# Patient Record
Sex: Female | Born: 1990 | Race: Black or African American | Hispanic: No | Marital: Single | Smoking: Never smoker
Health system: Southern US, Community
[De-identification: ages and names within clinical notes are randomized; demographics above are authoritative.]

## PROBLEM LIST (undated history)

## (undated) DIAGNOSIS — J45909 Unspecified asthma, uncomplicated: Secondary | ICD-10-CM

## (undated) DIAGNOSIS — I10 Essential (primary) hypertension: Secondary | ICD-10-CM

## (undated) DIAGNOSIS — O24419 Gestational diabetes mellitus in pregnancy, unspecified control: Secondary | ICD-10-CM

## (undated) HISTORY — DX: Essential (primary) hypertension: I10

## (undated) HISTORY — PX: NO PAST SURGERIES: SHX2092

## (undated) HISTORY — DX: Gestational diabetes mellitus in pregnancy, unspecified control: O24.419

---

## 2018-06-08 ENCOUNTER — Other Ambulatory Visit: Payer: Self-pay

## 2018-06-08 ENCOUNTER — Ambulatory Visit (HOSPITAL_COMMUNITY)
Admission: EM | Admit: 2018-06-08 | Discharge: 2018-06-08 | Disposition: A | Discharge status: Home / Self Care | Payer: Self-pay | Attending: Family Medicine | Admitting: Family Medicine

## 2018-06-08 ENCOUNTER — Encounter (HOSPITAL_COMMUNITY): Payer: Self-pay | Admitting: Emergency Medicine

## 2018-06-08 DIAGNOSIS — Z3201 Encounter for pregnancy test, result positive: Secondary | ICD-10-CM

## 2018-06-08 DIAGNOSIS — N925 Other specified irregular menstruation: Secondary | ICD-10-CM

## 2018-06-08 DIAGNOSIS — Z3491 Encounter for supervision of normal pregnancy, unspecified, first trimester: Secondary | ICD-10-CM

## 2018-06-08 HISTORY — DX: Unspecified asthma, uncomplicated: J45.909

## 2018-06-08 LAB — POCT PREGNANCY, URINE: PREG TEST UR: POSITIVE — AB

## 2018-06-08 MED ORDER — PRENATAL COMPLETE 14-0.4 MG PO TABS: 1.0000 | ORAL_TABLET | Freq: Every day | ORAL | 0 refills | Status: DC

## 2018-06-08 NOTE — ED Provider Notes (Signed)
[place]    CSN: [id number] Arrival date & time: 06/08/18  1158     History   Chief Complaint Chief Complaint  Patient presents with  . Possible Pregnancy    HPI Madison Allen is a 27 y.o. female.   Madison Allen presents with requests for pregnancy validation. LMP was two months ago, 8/6. Is not on birth control. Took a home pregnancy test which was positive. Had spotting last week but no further bleeding. Denies abdominal pain, nausea, bleeding, shortness of breath or any other concerns today. No previous pregnancies. Does not take any medications regularly. Hx of asthma.     ROS per HPI.      Past Medical History:  Diagnosis Date  . Asthma     There are no active problems to display for this patient.   History reviewed. No pertinent surgical history.  OB History   None      Home Medications    Prior to Admission medications   Medication Sig Start Date End Date Taking? Authorizing Provider  Prenatal Vit-Fe Fumarate-FA (PRENATAL COMPLETE) 14-0.4 MG TABS Take 1 tablet by mouth daily. 06/08/18   Zigmund Gottron, NP    Family History Family History  Problem Relation Age of Onset  . Diabetes Mother     Social History Social History   Tobacco Use  . Smoking status: Never Smoker  Substance Use Topics  . Alcohol use: Yes  . Drug use: Never     Allergies   Patient has no known allergies.   Review of Systems Review of Systems   Physical Exam Triage Vital Signs ED Triage Vitals [06/08/18 1250]  Enc Vitals Group     BP (!) 151/90     Pulse Rate 77     Resp 18     Temp 98.7 F (37.1 C)     Temp Source Oral     SpO2 100 %     Weight      Height      Head Circumference      Peak Flow      Pain Score      Pain Loc      Pain Edu?      Excl. in [place]?    No data found.  Updated Vital Signs BP (!) 151/90 (BP Location: Left Arm)   Pulse 77   Temp 98.7 F (37.1 C) (Oral)   Resp 18   LMP 04/07/2018   SpO2 100%    Physical  Exam  Constitutional: She is oriented to person, place, and time. She appears well-developed and well-nourished. No distress.  Cardiovascular: Normal rate, regular rhythm and normal heart sounds.  Pulmonary/Chest: Effort normal and breath sounds normal.  Abdominal: Soft. She exhibits no mass. There is no tenderness. There is no guarding.  Genitourinary:  Genitourinary Comments: Denies vaginal bleeding or discharge   Neurological: She is alert and oriented to person, place, and time.  Skin: Skin is warm and dry.     UC Treatments / Results  Labs (all labs ordered are listed, but only abnormal results are displayed) Labs Reviewed  POCT PREGNANCY, URINE - Abnormal; Notable for the following components:      Result Value   Preg Test, Ur POSITIVE (*)    All other components within normal limits    EKG None  Radiology No results found.  Procedures Procedures (including critical care time)  Medications Ordered in UC Medications - No data to display  Initial Impression /  Assessment and Plan / UC Course  I have reviewed the triage vital signs and the nursing notes.  Pertinent labs & imaging results that were available during my care of the patient were reviewed by me and considered in my medical decision making (see chart for details).     Positive urine pregnancy here in clinic today, consistent with positive home test. LMP 8/6, therefore approximately [redacted]w[redacted]d. Prenatal prescribed and encouraged to take daily. Encouraged to call to set up first prenatal appointment with OB. Return precautions provided and safe medication list provided. Patient verbalized understanding and agreeable to plan.  Ambulatory out of clinic without difficulty.   Final Clinical Impressions(s) / UC Diagnoses   Final diagnoses:  Positive pregnancy test     Discharge Instructions     Urine is positive for pregnancy here today.  Based on last period of 04/07/2018 you are approximately 8 weeks 6 days  pregnant.  Take prenatal vitamin daily.  See medications safe during pregnancy if needed.  Please establish with OB for first prenatal appointment.  [facility] to [facility] if develop abdominal pain or bleeding.    ED Prescriptions    Medication Sig Dispense Auth. Provider   Prenatal Vit-Fe Fumarate-FA (PRENATAL COMPLETE) 14-0.4 MG TABS Take 1 tablet by mouth daily. 60 each Zigmund Gottron, NP     Controlled Substance Prescriptions [place] Controlled Substance Registry consulted? Not Applicable   Zigmund Gottron, NP 06/08/18 1313

## 2018-06-08 NOTE — Discharge Instructions (Addendum)
Urine is positive for pregnancy here today.  Based on last period of 04/07/2018 you are approximately 8 weeks 6 days pregnant.  Take prenatal vitamin daily.  See medications safe during pregnancy if needed.  Please establish with OB for first prenatal appointment.  Please present to [facility] if develop abdominal pain or bleeding.

## 2018-06-08 NOTE — ED Triage Notes (Addendum)
Patient has not had a period in 2 months.  Home pregnancy test is positive.  Patient was spotting on Tuesday and Wednesday-oct 1 and 2.

## 2018-07-06 ENCOUNTER — Other Ambulatory Visit: Payer: Self-pay

## 2018-07-06 ENCOUNTER — Ambulatory Visit: Payer: Medicaid Other | Admitting: *Deleted

## 2018-07-06 DIAGNOSIS — O0990 Supervision of high risk pregnancy, unspecified, unspecified trimester: Secondary | ICD-10-CM | POA: Insufficient documentation

## 2018-07-06 DIAGNOSIS — Z3400 Encounter for supervision of normal first pregnancy, unspecified trimester: Secondary | ICD-10-CM

## 2018-07-06 LAB — POCT URINALYSIS DIPSTICK OB
Bilirubin, UA: NEGATIVE
Glucose, UA: NEGATIVE
Ketones, UA: NEGATIVE
NITRITE UA: NEGATIVE
PH UA: 7 (ref 5.0–8.0)
PROTEIN: NEGATIVE
RBC UA: NEGATIVE
SPEC GRAV UA: 1.02 (ref 1.010–1.025)
UROBILINOGEN UA: 0.2 U/dL

## 2018-07-06 NOTE — Progress Notes (Signed)
   PRENATAL INTAKE SUMMARY  Ms. Langenderfer presents today New OB Nurse Interview.  OB History    Gravida  1   Para      Term      Preterm      AB      Living        SAB      TAB      Ectopic      Multiple      Live Births             I have reviewed the patient's medical, obstetrical, social, and family histories, medications, and available lab results.  SUBJECTIVE She has no unusual complaints  OBJECTIVE Initial nurse interview for labs and history (New OB). EDD: 01/12/2019 by LMP GA: [redacted]w[redacted]d G1P0  GENERAL APPEARANCE: alert, well appearing, in no apparent distress, oriented to person, place and time   ASSESSMENT Normal pregnancy.  PLAN Prenatal care-[facility] P[facility]/HIV  OB Urine Culture/dip GC/CT at next visit with CNM HgbEval SMA CF A1C PAP: need records from previous provider. Continue PNV. U/S <14 wks to confirm dating and fetal viability.  Derl Barrow, RN

## 2018-07-06 NOTE — Patient Instructions (Addendum)
Preterm Labor and Birth Information Pregnancy normally lasts 39-41 weeks. Preterm labor is when labor starts early. It starts before you have been pregnant for 37 whole weeks. What are the risk factors for preterm labor? Preterm labor is more likely to occur in women who:  Have an infection while pregnant.  Have a cervix that is short.  Have gone into preterm labor before.  Have had surgery on their cervix.  Are younger than age 27.  Are older than age 71.  Are African American.  Are pregnant with two or more babies.  Take street drugs while pregnant.  Smoke while pregnant.  Do not gain enough weight while pregnant.  Got pregnant right after another pregnancy.  What are the symptoms of preterm labor? Symptoms of preterm labor include:  Cramps. The cramps may feel like the cramps some women get during their period. The cramps may happen with watery poop (diarrhea).  Pain in the belly (abdomen).  Pain in the lower back.  Regular contractions or tightening. It may feel like your belly is getting tighter.  Pressure in the lower belly that seems to get stronger.  More fluid (discharge) leaking from the vagina. The fluid may be watery or bloody.  Water breaking.  Why is it important to notice signs of preterm labor? Babies who are born early may not be fully developed. They have a higher chance for:  Long-term heart problems.  Long-term lung problems.  Trouble controlling body systems, like breathing.  Bleeding in the brain.  A condition called cerebral palsy.  Learning difficulties.  Death.  These risks are highest for babies who are born before 30 weeks of pregnancy. How is preterm labor treated? Treatment depends on:  How long you were pregnant.  Your condition.  The health of your baby.  Treatment may involve:  Having a stitch (suture) placed in your cervix. When you give birth, your cervix opens so the baby can come out. The stitch keeps  the cervix from opening too soon.  Staying at the hospital.  Taking or getting medicines, such as: ? Hormone medicines. ? Medicines to stop contractions. ? Medicines to help the baby's lungs develop. ? Medicines to prevent your baby from having cerebral palsy.  What should I do if I am in preterm labor? If you think you are going into labor too soon, call your doctor right away. How can I prevent preterm labor?  Do not use any tobacco products. ? Examples of these are cigarettes, chewing tobacco, and e-cigarettes. ? If you need help quitting, ask your doctor.  Do not use street drugs.  Do not use any medicines unless you ask your doctor if they are safe for you.  Talk with your doctor before taking any herbal supplements.  Make sure you gain enough weight.  Watch for infection. If you think you might have an infection, get it checked right away.  If you have gone into preterm labor before, tell your doctor. This information is not intended to replace advice given to you by your health care provider. Make sure you discuss any questions you have with your health care provider. Document Released: 11/15/2008 Document Revised: 01/30/2016 Document Reviewed: 01/10/2016 Elsevier Interactive Patient Education  2018 [place] of Pregnancy The second trimester is from week 13 through week 28, month 4 through 6. This is often the time in pregnancy that you feel your best. Often times, morning sickness has lessened or quit. You may have  more energy, and you may get hungry more often. Your unborn baby (fetus) is growing rapidly. At the end of the sixth month, he or she is about 9 inches long and weighs about 1 pounds. You will likely feel the baby move (quickening) between 18 and 20 weeks of pregnancy. Follow these instructions at home:  Avoid all smoking, herbs, and alcohol. Avoid drugs not approved by your doctor.  Do not use any tobacco products, including  cigarettes, chewing tobacco, and electronic cigarettes. If you need help quitting, ask your doctor. You may get counseling or other support to help you quit.  Only take medicine as told by your doctor. Some medicines are safe and some are not during pregnancy.  Exercise only as told by your doctor. Stop exercising if you start having cramps.  Eat regular, healthy meals.  Wear a good support bra if your breasts are tender.  Do not use hot tubs, steam rooms, or saunas.  Wear your seat belt when driving.  Avoid raw meat, uncooked cheese, and liter boxes and soil used by cats.  Take your prenatal vitamins.  Take 1500-2000 milligrams of calcium daily starting at the 20th week of pregnancy until you deliver your baby.  Try taking medicine that helps you poop (stool softener) as needed, and if your doctor approves. Eat more fiber by eating fresh fruit, vegetables, and whole grains. Drink enough fluids to keep your pee (urine) clear or pale yellow.  Take warm water baths (sitz baths) to soothe pain or discomfort caused by hemorrhoids. Use hemorrhoid cream if your doctor approves.  If you have puffy, bulging veins (varicose veins), wear support hose. Raise (elevate) your feet for 15 minutes, 3-4 times a day. Limit salt in your diet.  Avoid heavy lifting, wear low heals, and sit up straight.  Rest with your legs raised if you have leg cramps or low back pain.  Visit your dentist if you have not gone during your pregnancy. Use a soft toothbrush to brush your teeth. Be gentle when you floss.  You can have sex (intercourse) unless your doctor tells you not to.  Go to your doctor visits. Get help if:  You feel dizzy.  You have mild cramps or pressure in your lower belly (abdomen).  You have a nagging pain in your belly area.  You continue to feel sick to your stomach (nauseous), throw up (vomit), or have watery poop (diarrhea).  You have bad smelling fluid coming from your  vagina.  You have pain with peeing (urination). Get help right away if:  You have a fever.  You are leaking fluid from your vagina.  You have spotting or bleeding from your vagina.  You have severe belly cramping or pain.  You lose or gain weight rapidly.  You have trouble catching your breath and have chest pain.  You notice sudden or extreme puffiness (swelling) of your face, hands, ankles, feet, or legs.  You have not felt the baby move in over an hour.  You have severe headaches that do not go away with medicine.  You have vision changes. This information is not intended to replace advice given to you by your health care provider. Make sure you discuss any questions you have with your health care provider. Document Released: 11/13/2009 Document Revised: 01/25/2016 Document Reviewed: 10/20/2012 Elsevier Interactive Patient Education  2017 [employer].

## 2018-07-08 LAB — CULTURE, OB URINE

## 2018-07-08 LAB — URINE CULTURE, OB REFLEX: Organism ID, Bacteria: NO GROWTH

## 2018-07-09 ENCOUNTER — Other Ambulatory Visit: Payer: Self-pay | Admitting: Obstetrics and Gynecology

## 2018-07-09 ENCOUNTER — Ambulatory Visit (HOSPITAL_COMMUNITY)
Admission: RE | Admit: 2018-07-09 | Discharge: 2018-07-09 | Disposition: A | Discharge status: Home / Self Care | Payer: Medicaid Other | Source: Ambulatory Visit | Attending: Obstetrics and Gynecology | Admitting: Obstetrics and Gynecology

## 2018-07-09 DIAGNOSIS — Z3689 Encounter for other specified antenatal screening: Secondary | ICD-10-CM | POA: Insufficient documentation

## 2018-07-09 DIAGNOSIS — Z3A12 12 weeks gestation of pregnancy: Secondary | ICD-10-CM | POA: Insufficient documentation

## 2018-07-09 DIAGNOSIS — Z3400 Encounter for supervision of normal first pregnancy, unspecified trimester: Secondary | ICD-10-CM

## 2018-07-15 LAB — OBSTETRIC PANEL, INCLUDING HIV
ANTIBODY SCREEN: NEGATIVE
BASOS: 0 %
Basophils Absolute: 0 10*3/uL (ref 0.0–0.2)
EOS (ABSOLUTE): 0.2 10*3/uL (ref 0.0–0.4)
EOS: 3 %
HEMATOCRIT: 37.4 % (ref 34.0–46.6)
HEMOGLOBIN: 13.3 g/dL (ref 11.1–15.9)
HIV Screen 4th Generation wRfx: NONREACTIVE
Hepatitis B Surface Ag: NEGATIVE
Immature Grans (Abs): 0 10*3/uL (ref 0.0–0.1)
Immature Granulocytes: 0 %
Lymphocytes Absolute: 2 10*3/uL (ref 0.7–3.1)
Lymphs: 29 %
MCH: 31.8 pg (ref 26.6–33.0)
MCHC: 35.6 g/dL (ref 31.5–35.7)
MCV: 90 fL (ref 79–97)
MONOCYTES: 10 %
Monocytes Absolute: 0.7 10*3/uL (ref 0.1–0.9)
Neutrophils Absolute: 3.9 10*3/uL (ref 1.4–7.0)
Neutrophils: 58 %
Platelets: 226 10*3/uL (ref 150–450)
RBC: 4.18 x10E6/uL (ref 3.77–5.28)
RDW: 13.2 % (ref 12.3–15.4)
RH TYPE: POSITIVE
RPR Ser Ql: NONREACTIVE
RUBELLA: 3.32 {index} (ref >0.99)
WBC: 6.8 10*3/uL (ref 3.4–10.8)

## 2018-07-15 LAB — HEMOGLOBIN A1C
ESTIMATED AVERAGE GLUCOSE: 100 mg/dL
HEMOGLOBIN A1C: 5.1 % (ref 4.8–5.6)

## 2018-07-15 LAB — CYSTIC FIBROSIS MUTATION 97: Interpretation: NOT DETECTED

## 2018-07-15 LAB — SICKLE CELL SCREEN: Sickle Cell Screen: NEGATIVE

## 2018-07-17 ENCOUNTER — Encounter: Payer: Self-pay | Admitting: General Practice

## 2018-07-17 ENCOUNTER — Encounter: Payer: Self-pay | Admitting: Family

## 2018-07-17 ENCOUNTER — Ambulatory Visit (INDEPENDENT_AMBULATORY_CARE_PROVIDER_SITE_OTHER): Payer: Medicaid Other | Admitting: Family

## 2018-07-17 ENCOUNTER — Other Ambulatory Visit (HOSPITAL_COMMUNITY)
Admission: RE | Admit: 2018-07-17 | Discharge: 2018-07-17 | Disposition: A | Discharge status: Home / Self Care | Payer: Medicaid Other | Source: Ambulatory Visit | Attending: Family | Admitting: Family

## 2018-07-17 VITALS — BP 130/79 | HR 78 | Wt 196.6 lb

## 2018-07-17 DIAGNOSIS — O162 Unspecified maternal hypertension, second trimester: Secondary | ICD-10-CM

## 2018-07-17 DIAGNOSIS — Z3400 Encounter for supervision of normal first pregnancy, unspecified trimester: Secondary | ICD-10-CM | POA: Diagnosis present

## 2018-07-17 DIAGNOSIS — Z3402 Encounter for supervision of normal first pregnancy, second trimester: Secondary | ICD-10-CM

## 2018-07-17 DIAGNOSIS — O10919 Unspecified pre-existing hypertension complicating pregnancy, unspecified trimester: Secondary | ICD-10-CM | POA: Insufficient documentation

## 2018-07-17 DIAGNOSIS — Z833 Family history of diabetes mellitus: Secondary | ICD-10-CM

## 2018-07-17 NOTE — Addendum Note (Signed)
Addended by: Tommas Olp B on: 07/17/2018 09:56 AM   Modules accepted: Orders

## 2018-07-17 NOTE — Patient Instructions (Signed)

## 2018-07-17 NOTE — Progress Notes (Signed)
  Subjective:    Madison Allen is a G1P0 71w3dbeing seen today for her first obstetrical visit.  Here with FOB JFara Oldenwho plans to attend each visit.  Her obstetrical history is significant for first pregnancy. Patient does intend to breast feed. Pregnancy history fully reviewed.  Patient reports nausea and vomiting in early pregnancy which has resolved in the past week.  Fatigue also improving.  .  There were no vitals filed for this visit.  HISTORY: OB History  Gravida Para Term Preterm AB Living  1            SAB TAB Ectopic Multiple Live Births               # Outcome Date GA Lbr Len/2nd Weight Sex Delivery Anes PTL Lv  1 Current            Past Medical History:  Diagnosis Date  . Asthma    Past Surgical History:  Procedure Laterality Date  . NO PAST SURGERIES     Family History  Problem Relation Age of Onset  . Diabetes Mother      Exam    BP 130/79   Pulse 78   Wt 196 lb 9.6 oz (89.2 kg)   LMP 04/07/2018 (Exact Date)   BMI 29.89 kg/m  Uterine Size: size equals dates  Pelvic Exam:    Perineum: No Hemorrhoids, Normal Perineum   Vulva: normal   Vagina:  normal mucosa, normal discharge, no palpable nodules   pH: Not done   Cervix: Slight bleeding following Pap, yellowish discharge; no cervical motion tenderness and no lesions   Adnexa: normal adnexa and no mass, fullness, tenderness   Bony Pelvis: Adequate  System: Breast:  No nipple retraction or dimpling, No nipple discharge or bleeding, No axillary or supraclavicular adenopathy, Normal to palpation without dominant masses   Skin: normal coloration and turgor, no rashes    Neurologic: negative   Extremities: normal strength, tone, and muscle mass   HEENT neck supple with midline trachea and thyroid without masses   Mouth/Teeth mucous membranes moist, pharynx normal without lesions   Neck supple and no masses   Cardiovascular: regular rate and rhythm, no murmurs or gallops   Respiratory:  appears well,  vitals normal, no respiratory distress, acyanotic, normal RR, neck free of mass or lymphadenopathy, chest clear, no wheezing, crepitations, rhonchi, normal symmetric air entry   Abdomen: soft, non-tender; bowel sounds normal; no masses,  no organomegaly   Urinary: urethral meatus normal    Assessment:    Pregnancy: G1P0 Patient Active Problem List   Diagnosis Date Noted  . Supervision of normal first pregnancy, antepartum 07/06/2018        Plan:     Initial labs reviewed. Prenatal vitamins. Problem list reviewed and updated. Genetic Screening discussed: desires [place] .  Will obtain 2 hr at that visit as well.  Ultrasound discussed; fetal survey: Plan to order at 19-20 weeks at next visit. [facility] and may have a provider in labor that they have not met.  Follow up in 2 weeks for lab visit and 4 weeks for appt.   WVenia CarbonN Karim-Rhoades 07/17/2018

## 2018-07-20 LAB — CYTOLOGY - PAP: DIAGNOSIS: NEGATIVE

## 2018-07-23 ENCOUNTER — Encounter: Payer: Self-pay | Admitting: General Practice

## 2018-07-29 ENCOUNTER — Encounter: Payer: Self-pay | Admitting: General Practice

## 2018-07-29 ENCOUNTER — Other Ambulatory Visit (INDEPENDENT_AMBULATORY_CARE_PROVIDER_SITE_OTHER): Payer: Medicaid Other

## 2018-07-29 DIAGNOSIS — O162 Unspecified maternal hypertension, second trimester: Secondary | ICD-10-CM | POA: Diagnosis not present

## 2018-07-29 DIAGNOSIS — Z833 Family history of diabetes mellitus: Secondary | ICD-10-CM

## 2018-07-29 NOTE — Progress Notes (Signed)
Patient is here for lab work only

## 2018-07-30 LAB — COMPREHENSIVE METABOLIC PANEL
ALT: 20 IU/L (ref 0–32)
AST: 19 IU/L (ref 0–40)
Albumin/Globulin Ratio: 1.5 (ref 1.2–2.2)
Albumin: 4 g/dL (ref 3.5–5.5)
Alkaline Phosphatase: 63 IU/L (ref 39–117)
BILIRUBIN TOTAL: 0.4 mg/dL (ref 0.0–1.2)
BUN/Creatinine Ratio: 10 (ref 9–23)
BUN: 7 mg/dL (ref 6–20)
CALCIUM: 9.4 mg/dL (ref 8.7–10.2)
CHLORIDE: 102 mmol/L (ref 96–106)
CO2: 20 mmol/L (ref 20–29)
CREATININE: 0.71 mg/dL (ref 0.57–1.00)
GFR calc Af Amer: 135 mL/min/{1.73_m2} (ref >59)
GFR calc non Af Amer: 117 mL/min/{1.73_m2} (ref >59)
GLUCOSE: 87 mg/dL (ref 65–99)
Globulin, Total: 2.6 g/dL (ref 1.5–4.5)
Potassium: 4.1 mmol/L (ref 3.5–5.2)
Sodium: 136 mmol/L (ref 134–144)
TOTAL PROTEIN: 6.6 g/dL (ref 6.0–8.5)

## 2018-07-30 LAB — PROTEIN / CREATININE RATIO, URINE
Creatinine, Urine: 134.8 mg/dL
PROTEIN/CREAT RATIO: 56 mg/g{creat} (ref 0–200)
Protein, Ur: 7.6 mg/dL

## 2018-07-30 LAB — GLUCOSE TOLERANCE, 2 HOURS W/ 1HR
GLUCOSE, 2 HOUR: 97 mg/dL (ref 65–152)
GLUCOSE, FASTING: 82 mg/dL (ref 65–91)
Glucose, 1 hour: 112 mg/dL (ref 65–179)

## 2018-08-07 ENCOUNTER — Encounter: Payer: Self-pay | Admitting: General Practice

## 2018-08-10 ENCOUNTER — Encounter: Payer: Self-pay | Admitting: General Practice

## 2018-08-14 ENCOUNTER — Ambulatory Visit (INDEPENDENT_AMBULATORY_CARE_PROVIDER_SITE_OTHER): Payer: Medicaid Other | Admitting: Family

## 2018-08-14 ENCOUNTER — Encounter: Payer: Self-pay | Admitting: Family

## 2018-08-14 VITALS — BP 130/78 | HR 75 | Wt 202.0 lb

## 2018-08-14 DIAGNOSIS — O10919 Unspecified pre-existing hypertension complicating pregnancy, unspecified trimester: Secondary | ICD-10-CM

## 2018-08-14 DIAGNOSIS — O162 Unspecified maternal hypertension, second trimester: Secondary | ICD-10-CM

## 2018-08-14 DIAGNOSIS — Z3A19 19 weeks gestation of pregnancy: Secondary | ICD-10-CM

## 2018-08-14 DIAGNOSIS — O10912 Unspecified pre-existing hypertension complicating pregnancy, second trimester: Secondary | ICD-10-CM

## 2018-08-14 MED ORDER — BLOOD PRESSURE MONITORING DEVI: 0 refills | Status: DC

## 2018-08-14 NOTE — Progress Notes (Signed)
   PRENATAL VISIT NOTE  Subjective:  Madison Allen is a 27 y.o. G1P0 at [redacted]w[redacted]d being seen today for ongoing prenatal care.  Here with FOB Fara Olden.  She is currently monitored for the following issues for this low-risk pregnancy and has Supervision of normal first pregnancy, antepartum and Elevated blood pressure affecting pregnancy in second trimester, antepartum on their problem list.  Patient reports fatigue.  Contractions: Not present. Vag. Bleeding: None.  Movement: Present. Denies leaking of fluid.   The following portions of the patient's history were reviewed and updated as appropriate: allergies, current medications, past family history, past medical history, past social history, past surgical history and problem list. Problem list updated.  Objective:   Vitals:   08/14/18 1112 08/14/18 1119  BP: 136/90 130/78  Pulse: 83 75  Weight: 202 lb (91.6 kg)     Fetal Status: Fetal Heart Rate (bpm): 147 Fundal Height: 20 cm Movement: Present     General:  Alert, oriented and cooperative. Patient is in no acute distress.  Skin: Skin is warm and dry. No rash noted.   Cardiovascular: Normal heart rate noted  Respiratory: Normal respiratory effort, no problems with respiration noted  Abdomen: Soft, gravid, appropriate for gestational age.  Pain/Pressure: Present     Pelvic: Cervical exam deferred        Extremities: Normal range of motion.  Edema: None  Mental Status: Normal mood and affect. Normal behavior. Normal judgment and thought content.   Assessment and Plan:  Pregnancy: G1P0 at [redacted]w[redacted]d  1. Supervision of normal first pregnancy, antepartum - Reviewed lab results - Obtain GC/CT (unable to urinate)  2. Elevated blood pressure affecting pregnancy in second trimester, antepartum - [place] MFM OB DETAIL +14 WK; Future - Blood Pressure Monitoring DEVI; Check once/week; if greater than 150/100 notify office  Dispense: 1 Device; Refill: 0 - Enroll Patient in Babyscripts  3. [redacted] weeks  gestation of pregnancy - [place] MFM OB DETAIL +14 WK; Future  Preterm labor symptoms and general obstetric precautions including but not limited to vaginal bleeding, contractions, leaking of fluid and fetal movement were reviewed in detail with the patient. Please refer to After Visit Summary for other counseling recommendations.  Return in about 4 weeks (around 09/11/2018).  Future Appointments  Date Time Provider [place]  08/21/2018 11:00 AM [facility] [place] 3 WH-MFCUS MFC-US  09/11/2018 10:10 AM Cyndee Brightly, CNM [facility] None    Cyndee Brightly, CNM

## 2018-08-21 ENCOUNTER — Ambulatory Visit (HOSPITAL_COMMUNITY)
Admission: RE | Admit: 2018-08-21 | Discharge: 2018-08-21 | Disposition: A | Discharge status: Home / Self Care | Payer: Medicaid Other | Source: Ambulatory Visit | Attending: Family | Admitting: Family

## 2018-08-21 ENCOUNTER — Encounter (HOSPITAL_COMMUNITY): Payer: Self-pay

## 2018-08-21 ENCOUNTER — Other Ambulatory Visit (HOSPITAL_COMMUNITY): Payer: Self-pay | Admitting: *Deleted

## 2018-08-21 DIAGNOSIS — O162 Unspecified maternal hypertension, second trimester: Secondary | ICD-10-CM

## 2018-08-21 DIAGNOSIS — Z3A19 19 weeks gestation of pregnancy: Secondary | ICD-10-CM | POA: Insufficient documentation

## 2018-08-21 DIAGNOSIS — O10919 Unspecified pre-existing hypertension complicating pregnancy, unspecified trimester: Secondary | ICD-10-CM

## 2018-09-02 NOTE — L&D Delivery Note (Addendum)
Delivery Note Progressed quickly to complete dilation and began to push involuntarily  At 7:20 AM a viable and healthy female was delivered via Vaginal, Spontaneous (Presentation:LOA ).  APGAR: 9, 9; weight  .   Placenta status: spontaneous and grossly intact with 3 vessel Cord:  with the following complications: none  Anesthesia:  Local with repair Episiotomy: None Lacerations: 2nd degree;Labial Suture Repair: vicryl rapide Est. Blood Loss (mL):  Being measured  Mom to postpartum.  Baby to Couplet care / Skin to Skin.  Hansel Feinstein 01/13/2019, 7:58 AM  Please schedule this patient for Postpartum visit in: 1 week with the following provider: Any provider For C/S patients schedule nurse incision check in weeks 2 weeks: no High risk pregnancy complicated by: HTN Delivery mode:  SVD Anticipated Birth Control:  OCPs PP Procedures needed: BP check  Schedule Integrated [place] visit: no

## 2018-09-03 ENCOUNTER — Encounter: Payer: Self-pay | Admitting: Family

## 2018-09-11 ENCOUNTER — Ambulatory Visit (INDEPENDENT_AMBULATORY_CARE_PROVIDER_SITE_OTHER): Payer: Medicaid Other | Admitting: Obstetrics and Gynecology

## 2018-09-11 ENCOUNTER — Encounter: Payer: Self-pay | Admitting: Obstetrics and Gynecology

## 2018-09-11 VITALS — BP 123/71 | HR 88 | Wt 200.4 lb

## 2018-09-11 DIAGNOSIS — O0992 Supervision of high risk pregnancy, unspecified, second trimester: Secondary | ICD-10-CM

## 2018-09-11 DIAGNOSIS — Z3A22 22 weeks gestation of pregnancy: Secondary | ICD-10-CM

## 2018-09-11 DIAGNOSIS — O10912 Unspecified pre-existing hypertension complicating pregnancy, second trimester: Secondary | ICD-10-CM

## 2018-09-11 DIAGNOSIS — O10919 Unspecified pre-existing hypertension complicating pregnancy, unspecified trimester: Secondary | ICD-10-CM

## 2018-09-11 DIAGNOSIS — O0990 Supervision of high risk pregnancy, unspecified, unspecified trimester: Secondary | ICD-10-CM

## 2018-09-11 MED ORDER — BLOOD PRESSURE MONITOR DEVI: 0 refills | Status: DC

## 2018-09-11 MED ORDER — ASPIRIN EC 81 MG PO TBEC: 81.0000 mg | DELAYED_RELEASE_TABLET | Freq: Every day | ORAL | 2 refills | Status: DC

## 2018-09-11 NOTE — Progress Notes (Signed)
   PRENATAL VISIT NOTE  Subjective:  Madison Allen is a 28 y.o. G1P0 at [redacted]w[redacted]d being seen today for ongoing prenatal care.  She is currently monitored for the following issues for this high-risk pregnancy and has Supervision of high risk pregnancy, antepartum and Chronic hypertension affecting pregnancy on their problem list.  Patient reports no complaints.  Contractions: Not present. Vag. Bleeding: None.  Movement: Present. Denies leaking of fluid.   The following portions of the patient's history were reviewed and updated as appropriate: allergies, current medications, past family history, past medical history, past social history, past surgical history and problem list. Problem list updated.  Objective:   Vitals:   09/11/18 1014 09/11/18 1031  BP: (!) 149/79 123/71  Pulse: 96 88  Weight: 200 lb 6.4 oz (90.9 kg)     Fetal Status: Fetal Heart Rate (bpm): 145 Fundal Height: 22 cm Movement: Present     General:  Alert, oriented and cooperative. Patient is in no acute distress.  Skin: Skin is warm and dry. No rash noted.   Cardiovascular: Normal heart rate noted  Respiratory: Normal respiratory effort, no problems with respiration noted  Abdomen: Soft, gravid, appropriate for gestational age.  Pain/Pressure: Absent     Pelvic: Cervical exam deferred        Extremities: Normal range of motion.  Edema: None  Mental Status: Normal mood and affect. Normal behavior. Normal judgment and thought content.   Assessment and Plan:  Pregnancy: G1P0 at [redacted]w[redacted]d  1. Supervision of high risk pregnancy, antepartum Patient is doing well without complaints Third trimester labs next visit  2. Chronic hypertension affecting pregnancy Asymptomatic without medication Follow up growth ultrasound on 1/17 Rx ASA provided  Preterm labor symptoms and general obstetric precautions including but not limited to vaginal bleeding, contractions, leaking of fluid and fetal movement were reviewed in detail with  the patient. Please refer to After Visit Summary for other counseling recommendations.  Return in about 4 weeks (around 10/09/2018) for ROB, 2 hr glucola next visit.  Future Appointments  Date Time Provider [place]  09/18/2018 12:45 PM [place] 2 WH-MFCUS [facility]-US    Mora Bellman, MD

## 2018-09-18 ENCOUNTER — Other Ambulatory Visit (HOSPITAL_COMMUNITY): Payer: Self-pay | Admitting: *Deleted

## 2018-09-18 ENCOUNTER — Encounter (HOSPITAL_COMMUNITY): Payer: Self-pay

## 2018-09-18 ENCOUNTER — Ambulatory Visit (HOSPITAL_COMMUNITY)
Admission: RE | Admit: 2018-09-18 | Discharge: 2018-09-18 | Disposition: A | Discharge status: Home / Self Care | Payer: Medicaid Other | Source: Ambulatory Visit | Attending: Family | Admitting: Family

## 2018-09-18 DIAGNOSIS — O10919 Unspecified pre-existing hypertension complicating pregnancy, unspecified trimester: Secondary | ICD-10-CM | POA: Diagnosis present

## 2018-09-18 DIAGNOSIS — O162 Unspecified maternal hypertension, second trimester: Secondary | ICD-10-CM

## 2018-09-18 DIAGNOSIS — Z3A23 23 weeks gestation of pregnancy: Secondary | ICD-10-CM

## 2018-09-18 DIAGNOSIS — O0990 Supervision of high risk pregnancy, unspecified, unspecified trimester: Secondary | ICD-10-CM | POA: Diagnosis present

## 2018-09-18 DIAGNOSIS — Z362 Encounter for other antenatal screening follow-up: Secondary | ICD-10-CM

## 2018-10-09 ENCOUNTER — Other Ambulatory Visit: Payer: Medicaid Other | Admitting: *Deleted

## 2018-10-09 ENCOUNTER — Ambulatory Visit (INDEPENDENT_AMBULATORY_CARE_PROVIDER_SITE_OTHER): Payer: Medicaid Other | Admitting: Family

## 2018-10-09 VITALS — BP 119/74 | HR 84 | Temp 98.1°F | Wt 205.4 lb

## 2018-10-09 DIAGNOSIS — O0990 Supervision of high risk pregnancy, unspecified, unspecified trimester: Secondary | ICD-10-CM

## 2018-10-09 DIAGNOSIS — O0992 Supervision of high risk pregnancy, unspecified, second trimester: Secondary | ICD-10-CM

## 2018-10-09 DIAGNOSIS — O10912 Unspecified pre-existing hypertension complicating pregnancy, second trimester: Secondary | ICD-10-CM

## 2018-10-09 DIAGNOSIS — O10919 Unspecified pre-existing hypertension complicating pregnancy, unspecified trimester: Secondary | ICD-10-CM

## 2018-10-09 NOTE — Progress Notes (Signed)
   Patient in clinic for 28 week lab work.  Derl Barrow, RN

## 2018-10-09 NOTE — Progress Notes (Signed)
   PRENATAL VISIT NOTE  Subjective:  Madison Allen is a 28 y.o. G1P0 at [redacted]w[redacted]d being seen today for ongoing prenatal care.  She is currently monitored for the following issues for this high-risk pregnancy and has Supervision of high risk pregnancy, antepartum and Chronic hypertension affecting pregnancy on their problem list.  Patient reports no complaints.  Denies headache, vision changes, or epigastric pain.  Contractions: Not present. Vag. Bleeding: None.  Movement: Present. Denies leaking of fluid.   The following portions of the patient's history were reviewed and updated as appropriate: allergies, current medications, past family history, past medical history, past social history, past surgical history and problem list. Problem list updated.  Objective:   Vitals:   10/09/18 0823 10/09/18 0847  BP: (!) 167/97 119/74  Pulse: 94 84  Temp: 98.1 F (36.7 C)   Weight: 205 lb 6.4 oz (93.2 kg)   Blood pressure retaken; first obtained with increased patient movement  Fetal Status: Fetal Heart Rate (bpm): 143 Fundal Height: 27 cm Movement: Present     General:  Alert, oriented and cooperative. Patient is in no acute distress.  Skin: Skin is warm and dry. No rash noted.   Cardiovascular: Normal heart rate noted  Respiratory: Normal respiratory effort, no problems with respiration noted  Abdomen: Soft, gravid, appropriate for gestational age.  Pain/Pressure: Absent     Pelvic: Cervical exam deferred        Extremities: Normal range of motion.  Edema: Trace  Mental Status: Normal mood and affect. Normal behavior. Normal judgment and thought content.   Assessment and Plan:  Pregnancy: G1P0 at [redacted]w[redacted]d  1. Supervision of high risk pregnancy, antepartum - HIV Antibody (routine testing w rflx) - RPR - CBC - Culture, OB Urine - Urine cytology ancillary only([facility])  2. Chronic hypertension affecting pregnancy - Continue ASA -  Reviewed previous growth ultrasound (23 weeks > 45%ile);  next one scheduled 10/16/18 - Comprehensive metabolic panel - Protein / creatinine ratio, urine - Reviewed symptoms of prex  Preterm labor symptoms and general obstetric precautions including but not limited to vaginal bleeding, contractions, leaking of fluid and fetal movement were reviewed in detail with the patient. Please refer to After Visit Summary for other counseling recommendations.  Return in about 2 weeks (around 10/23/2018).  Future Appointments  Date Time Provider [place]  10/16/2018  1:30 PM WH-[facility] [place] 1 WH-MFCUS MFC-US    Cyndee Brightly, CNM

## 2018-10-10 LAB — COMPREHENSIVE METABOLIC PANEL
ALT: 14 IU/L (ref 0–32)
AST: 14 IU/L (ref 0–40)
Albumin/Globulin Ratio: 1.1 — ABNORMAL LOW (ref 1.2–2.2)
Albumin: 3.2 g/dL — ABNORMAL LOW (ref 3.9–5.0)
Alkaline Phosphatase: 77 IU/L (ref 39–117)
BUN / CREAT RATIO: 11 (ref 9–23)
BUN: 7 mg/dL (ref 6–20)
Bilirubin Total: 0.3 mg/dL (ref 0.0–1.2)
CO2: 20 mmol/L (ref 20–29)
Calcium: 8.5 mg/dL — ABNORMAL LOW (ref 8.7–10.2)
Chloride: 101 mmol/L (ref 96–106)
Creatinine, Ser: 0.65 mg/dL (ref 0.57–1.00)
GFR calc non Af Amer: 122 mL/min/{1.73_m2} (ref >59)
GFR, EST AFRICAN AMERICAN: 141 mL/min/{1.73_m2} (ref >59)
Globulin, Total: 2.8 g/dL (ref 1.5–4.5)
Glucose: 133 mg/dL — ABNORMAL HIGH (ref 65–99)
Potassium: 4 mmol/L (ref 3.5–5.2)
Sodium: 136 mmol/L (ref 134–144)
TOTAL PROTEIN: 6 g/dL (ref 6.0–8.5)

## 2018-10-10 LAB — CBC
HEMATOCRIT: 32.4 % — AB (ref 34.0–46.6)
Hemoglobin: 11.3 g/dL (ref 11.1–15.9)
MCH: 32.3 pg (ref 26.6–33.0)
MCHC: 34.9 g/dL (ref 31.5–35.7)
MCV: 93 fL (ref 79–97)
Platelets: 196 10*3/uL (ref 150–450)
RBC: 3.5 x10E6/uL — ABNORMAL LOW (ref 3.77–5.28)
RDW: 12.6 % (ref 11.7–15.4)
WBC: 6.2 10*3/uL (ref 3.4–10.8)

## 2018-10-10 LAB — URINE CYTOLOGY ANCILLARY ONLY
Chlamydia: NEGATIVE
Neisseria Gonorrhea: NEGATIVE

## 2018-10-10 LAB — HIV ANTIBODY (ROUTINE TESTING W REFLEX): HIV SCREEN 4TH GENERATION: NONREACTIVE

## 2018-10-10 LAB — PROTEIN / CREATININE RATIO, URINE
Creatinine, Urine: 99.1 mg/dL
Protein, Ur: 9.7 mg/dL
Protein/Creat Ratio: 98 mg/g creat (ref 0–200)

## 2018-10-10 LAB — GLUCOSE TOLERANCE, 2 HOURS W/ 1HR
GLUCOSE, 2 HOUR: 80 mg/dL (ref 65–152)
GLUCOSE, FASTING: 83 mg/dL (ref 65–91)
Glucose, 1 hour: 138 mg/dL (ref 65–179)

## 2018-10-10 LAB — RPR: RPR: NONREACTIVE

## 2018-10-12 LAB — CULTURE, OB URINE

## 2018-10-12 LAB — URINE CULTURE, OB REFLEX

## 2018-10-16 ENCOUNTER — Encounter (HOSPITAL_COMMUNITY): Payer: Self-pay

## 2018-10-16 ENCOUNTER — Ambulatory Visit (HOSPITAL_COMMUNITY)
Admission: RE | Admit: 2018-10-16 | Discharge: 2018-10-16 | Disposition: A | Discharge status: Home / Self Care | Payer: Medicaid Other | Source: Ambulatory Visit | Attending: Family | Admitting: Family

## 2018-10-16 ENCOUNTER — Other Ambulatory Visit (HOSPITAL_COMMUNITY): Payer: Self-pay | Admitting: *Deleted

## 2018-10-16 DIAGNOSIS — O0990 Supervision of high risk pregnancy, unspecified, unspecified trimester: Secondary | ICD-10-CM | POA: Diagnosis present

## 2018-10-16 DIAGNOSIS — O10919 Unspecified pre-existing hypertension complicating pregnancy, unspecified trimester: Secondary | ICD-10-CM

## 2018-10-16 DIAGNOSIS — Z3A27 27 weeks gestation of pregnancy: Secondary | ICD-10-CM

## 2018-10-16 DIAGNOSIS — Z362 Encounter for other antenatal screening follow-up: Secondary | ICD-10-CM | POA: Diagnosis not present

## 2018-10-16 DIAGNOSIS — O10012 Pre-existing essential hypertension complicating pregnancy, second trimester: Secondary | ICD-10-CM | POA: Diagnosis not present

## 2018-10-23 ENCOUNTER — Other Ambulatory Visit: Payer: Self-pay

## 2018-10-23 ENCOUNTER — Ambulatory Visit (INDEPENDENT_AMBULATORY_CARE_PROVIDER_SITE_OTHER): Payer: Medicaid Other | Admitting: Family

## 2018-10-23 VITALS — BP 134/76 | HR 88 | Wt 206.0 lb

## 2018-10-23 DIAGNOSIS — O10913 Unspecified pre-existing hypertension complicating pregnancy, third trimester: Secondary | ICD-10-CM

## 2018-10-23 DIAGNOSIS — O0990 Supervision of high risk pregnancy, unspecified, unspecified trimester: Secondary | ICD-10-CM

## 2018-10-23 DIAGNOSIS — O0993 Supervision of high risk pregnancy, unspecified, third trimester: Secondary | ICD-10-CM

## 2018-10-23 DIAGNOSIS — O10919 Unspecified pre-existing hypertension complicating pregnancy, unspecified trimester: Secondary | ICD-10-CM

## 2018-10-23 DIAGNOSIS — Z23 Encounter for immunization: Secondary | ICD-10-CM | POA: Diagnosis not present

## 2018-10-23 DIAGNOSIS — Z3A28 28 weeks gestation of pregnancy: Secondary | ICD-10-CM

## 2018-10-23 NOTE — Progress Notes (Signed)
   PRENATAL VISIT NOTE  Subjective:  Madison Allen is a 28 y.o. G1P0 at [redacted]w[redacted]d being seen today for ongoing prenatal care.  She is currently monitored for the following issues for this high-risk pregnancy and has Supervision of high risk pregnancy, antepartum and Chronic hypertension affecting pregnancy on their problem list.  Patient reports no complaints.  Contractions: Not present. Vag. Bleeding: None.  Movement: Present. Denies leaking of fluid.   The following portions of the patient's history were reviewed and updated as appropriate: allergies, current medications, past family history, past medical history, past social history, past surgical history and problem list. Problem list updated.  Objective:   Vitals:   10/23/18 1110  BP: 134/76  Pulse: 88  Weight: 206 lb (93.4 kg)    Fetal Status: Fetal Heart Rate (bpm): 139 Fundal Height: 30 cm Movement: Present     General:  Alert, oriented and cooperative. Patient is in no acute distress.  Skin: Skin is warm and dry. No rash noted.   Cardiovascular: Normal heart rate noted  Respiratory: Normal respiratory effort, no problems with respiration noted  Abdomen: Soft, gravid, appropriate for gestational age.  Pain/Pressure: Absent     Pelvic: Cervical exam deferred        Extremities: Normal range of motion.  Edema: None  Mental Status: Normal mood and affect. Normal behavior. Normal judgment and thought content.   Assessment and Plan:  Pregnancy: G1P0 at [redacted]w[redacted]d  1. Supervision of high risk pregnancy, antepartum - Tdap vaccine greater than or equal to 7yo IM  2. Chronic hypertension affecting pregnancy - Continue ASA - Reviewed growth ultrasound (31%ile) - Reviewed signs of preeclampsia  Preterm labor symptoms and general obstetric precautions including but not limited to vaginal bleeding, contractions, leaking of fluid and fetal movement were reviewed in detail with the patient. Please refer to After Visit Summary for other  counseling recommendations.  Return in about 2 weeks (around 11/06/2018).  Future Appointments  Date Time Provider [place]  11/06/2018 10:10 AM Cyndee Brightly, CNM [facility] None  11/13/2018  2:30 PM [place] [facility]  11/20/2018 10:10 AM Karim-Rhoades, Dara Lords, CNM [facility] None  12/04/2018 11:10 AM Ainsley Spinner, Dara Lords, CNM [facility] None  12/18/2018 11:10 AM Cyndee Brightly, CNM [facility] None  12/25/2018 11:10 AM Laury Deep, CNM [facility] None    Cyndee Brightly, CNM

## 2018-11-06 ENCOUNTER — Ambulatory Visit (INDEPENDENT_AMBULATORY_CARE_PROVIDER_SITE_OTHER): Payer: Medicaid Other | Admitting: Family

## 2018-11-06 ENCOUNTER — Other Ambulatory Visit: Payer: Self-pay

## 2018-11-06 VITALS — BP 129/75 | HR 94 | Wt 208.4 lb

## 2018-11-06 DIAGNOSIS — O0990 Supervision of high risk pregnancy, unspecified, unspecified trimester: Secondary | ICD-10-CM

## 2018-11-06 DIAGNOSIS — Z3A30 30 weeks gestation of pregnancy: Secondary | ICD-10-CM

## 2018-11-06 DIAGNOSIS — O10919 Unspecified pre-existing hypertension complicating pregnancy, unspecified trimester: Secondary | ICD-10-CM

## 2018-11-06 NOTE — Patient Instructions (Signed)
Preeclampsia and Eclampsia  Preeclampsia is a serious condition that may develop during pregnancy. It is also called toxemia of pregnancy. This condition causes high blood pressure along with other symptoms, such as swelling and headaches. These symptoms may develop as the condition gets worse. Preeclampsia may occur at 20 weeks of pregnancy or later. Diagnosing and treating preeclampsia early is very important. If not treated early, it can cause serious problems for you and your baby. One problem it can lead to is eclampsia. Eclampsia is a condition that causes muscle jerking or shaking (convulsions or seizures) and other serious problems for the mother. During pregnancy, delivering your baby may be the best treatment for preeclampsia or eclampsia. For most women, preeclampsia and eclampsia symptoms go away after giving birth. In rare cases, a woman may develop preeclampsia after giving birth (postpartum preeclampsia). This usually occurs within 48 hours after childbirth but may occur up to 6 weeks after giving birth. What are the causes? The cause of preeclampsia is not known. What increases the risk? The following risk factors make you more likely to develop preeclampsia:  Being pregnant for the first time.  Having had preeclampsia during a past pregnancy.  Having a family history of preeclampsia.  Having high blood pressure.  Being pregnant with more than one baby.  Being 63 or older.  Being African-American.  Having kidney disease or diabetes.  Having medical conditions such as lupus or blood diseases.  Being very overweight (obese). What are the signs or symptoms? The earliest signs of preeclampsia are:  High blood pressure.  Increased protein in your urine. Your health care provider will check for this at every visit before you give birth (prenatal visit). Other symptoms that may develop as the condition gets worse include:  Severe headaches.  Sudden weight  gain.  Swelling of the hands, face, legs, and feet.  Nausea and vomiting.  Vision problems, such as blurred or double vision.  Numbness in the face, arms, legs, and feet.  Urinating less than usual.  Dizziness.  Slurred speech.  Abdominal pain, especially upper abdominal pain.  Convulsions or seizures. How is this diagnosed? There are no screening tests for preeclampsia. Your health care provider will ask you about symptoms and check for signs of preeclampsia during your prenatal visits. You may also have tests that include:  Urine tests.  Blood tests.  Checking your blood pressure.  Monitoring your baby's heart rate.  Ultrasound. How is this treated? You and your health care provider will determine the treatment approach that is best for you. Treatment may include:  Having more frequent prenatal exams to check for signs of preeclampsia, if you have an increased risk for preeclampsia.  Medicine to lower your blood pressure.  Staying in the hospital, if your condition is severe. There, treatment will focus on controlling your blood pressure and the amount of fluids in your body (fluid retention).  Taking medicine (magnesium sulfate) to prevent seizures. This may be given as an injection or through an IV.  Taking a low-dose aspirin during your pregnancy.  Delivering your baby early, if your condition gets worse. You may have your labor started with medicine (induced), or you may have a cesarean delivery. Follow these instructions at home: Eating and drinking   Drink enough fluid to keep your urine pale yellow.  Avoid caffeine. Lifestyle  Do not use any products that contain nicotine or tobacco, such as cigarettes and e-cigarettes. If you need help quitting, ask your health care provider.  Do not use alcohol or drugs.  Avoid stress as much as possible. Rest and get plenty of sleep. General instructions  Take over-the-counter and prescription medicines only as  told by your health care provider.  When lying down, lie on your left side. This keeps pressure off your major blood vessels.  When sitting or lying down, raise (elevate) your feet. Try putting some pillows underneath your lower legs.  Exercise regularly. Ask your health care provider what kinds of exercise are best for you.  Keep all follow-up and prenatal visits as told by your health care provider. This is important. How is this prevented? There is no known way of preventing preeclampsia or eclampsia from developing. However, to lower your risk of complications and detect problems early:  Get regular prenatal care. Your health care provider may be able to diagnose and treat the condition early.  Maintain a healthy weight. Ask your health care provider for help managing weight gain during pregnancy.  Work with your health care provider to manage any long-term (chronic) health conditions you have, such as diabetes or kidney problems.  You may have tests of your blood pressure and kidney function after giving birth.  Your health care provider may have you take low-dose aspirin during your next pregnancy. Contact a health care provider if:  You have symptoms that your health care provider told you may require more treatment or monitoring, such as: ? Headaches. ? Nausea or vomiting. ? Abdominal pain. ? Dizziness. ? Light-headedness. Get help right away if:  You have severe: ? Abdominal pain. ? Headaches that do not get better. ? Dizziness. ? Vision problems. ? Confusion. ? Nausea or vomiting.  You have any of the following: ? A seizure. ? Sudden, rapid weight gain. ? Sudden swelling in your hands, ankles, or face. ? Trouble moving any part of your body. ? Numbness in any part of your body. ? Trouble speaking. ? Abnormal bleeding.  You faint. Summary  Preeclampsia is a serious condition that may develop during pregnancy. It is also called toxemia of pregnancy.  This  condition causes high blood pressure along with other symptoms, such as swelling and headaches.  Diagnosing and treating preeclampsia early is very important. If not treated early, it can cause serious problems for you and your baby.  Get help right away if you have symptoms that your health care provider told you to watch for. This information is not intended to replace advice given to you by your health care provider. Make sure you discuss any questions you have with your health care provider. Document Released: 08/16/2000 Document Revised: 08/05/2017 Document Reviewed: 03/25/2016 Elsevier Interactive Patient Education  2019 [employer] of Pregnancy The third trimester is from week 28 through week 40 (months 7 through 9). The third trimester is a time when the unborn baby (fetus) is growing rapidly. At the end of the ninth month, the fetus is about 20 inches in length and weighs 6-10 pounds. Body changes during your third trimester Your body will continue to go through many changes during pregnancy. The changes vary from woman to woman. During the third trimester:  Your weight will continue to increase. You can expect to gain 25-35 pounds (11-16 kg) by the end of the pregnancy.  You may begin to get stretch marks on your hips, abdomen, and breasts.  You may urinate more often because the fetus is moving lower into your pelvis and pressing on your bladder.  You may  develop or continue to have heartburn. This is caused by increased hormones that slow down muscles in the digestive tract.  You may develop or continue to have constipation because increased hormones slow digestion and cause the muscles that push waste through your intestines to relax.  You may develop hemorrhoids. These are swollen veins (varicose veins) in the rectum that can itch or be painful.  You may develop swollen, bulging veins (varicose veins) in your legs.  You may have increased body aches in  the pelvis, back, or thighs. This is due to weight gain and increased hormones that are relaxing your joints.  You may have changes in your hair. These can include thickening of your hair, rapid growth, and changes in texture. Some women also have hair loss during or after pregnancy, or hair that feels dry or thin. Your hair will most likely return to normal after your baby is born.  Your breasts will continue to grow and they will continue to become tender. A yellow fluid (colostrum) may leak from your breasts. This is the first milk you are producing for your baby.  Your belly button may stick out.  You may notice more swelling in your hands, face, or ankles.  You may have increased tingling or numbness in your hands, arms, and legs. The skin on your belly may also feel numb.  You may feel short of breath because of your expanding uterus.  You may have more problems sleeping. This can be caused by the size of your belly, increased need to urinate, and an increase in your body's metabolism.  You may notice the fetus "dropping," or moving lower in your abdomen (lightening).  You may have increased vaginal discharge.  You may notice your joints feel loose and you may have pain around your pelvic bone. What to expect at prenatal visits You will have prenatal exams every 2 weeks until week 36. Then you will have weekly prenatal exams. During a routine prenatal visit:  You will be weighed to make sure you and the baby are growing normally.  Your blood pressure will be taken.  Your abdomen will be measured to track your baby's growth.  The fetal heartbeat will be listened to.  Any test results from the previous visit will be discussed.  You may have a cervical check near your due date to see if your cervix has softened or thinned (effaced).  You will be tested for Group B streptococcus. This happens between 35 and 37 weeks. Your health care provider may ask you:  What your birth  plan is.  How you are feeling.  If you are feeling the baby move.  If you have had any abnormal symptoms, such as leaking fluid, bleeding, severe headaches, or abdominal cramping.  If you are using any tobacco products, including cigarettes, chewing tobacco, and electronic cigarettes.  If you have any questions. Other tests or screenings that may be performed during your third trimester include:  Blood tests that check for low iron levels (anemia).  Fetal testing to check the health, activity level, and growth of the fetus. Testing is done if you have certain medical conditions or if there are problems during the pregnancy.  Nonstress test (NST). This test checks the health of your baby to make sure there are no signs of problems, such as the baby not getting enough oxygen. During this test, a belt is placed around your belly. The baby is made to move, and its heart rate is monitored  during movement. What is false labor? False labor is a condition in which you feel small, irregular tightenings of the muscles in the womb (contractions) that usually go away with rest, changing position, or drinking water. These are called Braxton Hicks contractions. Contractions may last for hours, days, or even weeks before true labor sets in. If contractions come at regular intervals, become more frequent, increase in intensity, or become painful, you should see your health care provider. What are the signs of labor?  Abdominal cramps.  Regular contractions that start at 10 minutes apart and become stronger and more frequent with time.  Contractions that start on the top of the uterus and spread down to the lower abdomen and back.  Increased pelvic pressure and dull back pain.  A watery or bloody mucus discharge that comes from the vagina.  Leaking of amniotic fluid. This is also known as your "water breaking." It could be a slow trickle or a gush. Let your health care provider know if it has a color  or strange odor. If you have any of these signs, call your health care provider right away, even if it is before your due date. Follow these instructions at home: Medicines  Follow your health care provider's instructions regarding medicine use. Specific medicines may be either safe or unsafe to take during pregnancy.  Take a prenatal vitamin that contains at least 600 micrograms (mcg) of folic acid.  If you develop constipation, try taking a stool softener if your health care provider approves. Eating and drinking   Eat a balanced diet that includes fresh fruits and vegetables, whole grains, good sources of protein such as meat, eggs, or tofu, and low-fat dairy. Your health care provider will help you determine the amount of weight gain that is right for you.  Avoid raw meat and uncooked cheese. These carry germs that can cause birth defects in the baby.  If you have low calcium intake from food, talk to your health care provider about whether you should take a daily calcium supplement.  Eat four or five small meals rather than three large meals a day.  Limit foods that are high in fat and processed sugars, such as fried and sweet foods.  To prevent constipation: ? Drink enough fluid to keep your urine clear or pale yellow. ? Eat foods that are high in fiber, such as fresh fruits and vegetables, whole grains, and beans. Activity  Exercise only as directed by your health care provider. Most women can continue their usual exercise routine during pregnancy. Try to exercise for 30 minutes at least 5 days a week. Stop exercising if you experience uterine contractions.  Avoid heavy lifting.  Do not exercise in extreme heat or humidity, or at high altitudes.  Wear low-heel, comfortable shoes.  Practice good posture.  You may continue to have sex unless your health care provider tells you otherwise. Relieving pain and discomfort  Take frequent breaks and rest with your legs elevated  if you have leg cramps or low back pain.  Take warm sitz baths to soothe any pain or discomfort caused by hemorrhoids. Use hemorrhoid cream if your health care provider approves.  Wear a good support bra to prevent discomfort from breast tenderness.  If you develop varicose veins: ? Wear support pantyhose or compression stockings as told by your healthcare provider. ? Elevate your feet for 15 minutes, 3-4 times a day. Prenatal care  Write down your questions. Take them to your prenatal visits.  Keep all your prenatal visits as told by your health care provider. This is important. Safety  Wear your seat belt at all times when driving.  Make a list of emergency phone numbers, including numbers for family, friends, the hospital, and police and fire departments. General instructions  Avoid cat litter boxes and soil used by cats. These carry germs that can cause birth defects in the baby. If you have a cat, ask someone to clean the litter box for you.  Do not travel far distances unless it is absolutely necessary and only with the approval of your health care provider.  Do not use hot tubs, steam rooms, or saunas.  Do not drink alcohol.  Do not use any products that contain nicotine or tobacco, such as cigarettes and e-cigarettes. If you need help quitting, ask your health care provider.  Do not use any medicinal herbs or unprescribed drugs. These chemicals affect the formation and growth of the baby.  Do not douche or use tampons or scented sanitary pads.  Do not cross your legs for long periods of time.  To prepare for the arrival of your baby: ? Take prenatal classes to understand, practice, and ask questions about labor and delivery. ? Make a trial run to the hospital. ? Visit the hospital and tour the maternity area. ? Arrange for maternity or paternity leave through employers. ? Arrange for family and friends to take care of pets while you are in the hospital. ? Purchase a  rear-facing car seat and make sure you know how to install it in your car. ? Pack your hospital bag. ? Prepare the baby's nursery. Make sure to remove all pillows and stuffed animals from the baby's crib to prevent suffocation.  Visit your dentist if you have not gone during your pregnancy. Use a soft toothbrush to brush your teeth and be gentle when you floss. Contact a health care provider if:  You are unsure if you are in labor or if your water has broken.  You become dizzy.  You have mild pelvic cramps, pelvic pressure, or nagging pain in your abdominal area.  You have lower back pain.  You have persistent nausea, vomiting, or diarrhea.  You have an unusual or bad smelling vaginal discharge.  You have pain when you urinate. Get help right away if:  Your water breaks before 37 weeks.  You have regular contractions less than 5 minutes apart before 37 weeks.  You have a fever.  You are leaking fluid from your vagina.  You have spotting or bleeding from your vagina.  You have severe abdominal pain or cramping.  You have rapid weight loss or weight gain.  You have shortness of breath with chest pain.  You notice sudden or extreme swelling of your face, hands, ankles, feet, or legs.  Your baby makes fewer than 10 movements in 2 hours.  You have severe headaches that do not go away when you take medicine.  You have vision changes. Summary  The third trimester is from week 28 through week 40, months 7 through 9. The third trimester is a time when the unborn baby (fetus) is growing rapidly.  During the third trimester, your discomfort may increase as you and your baby continue to gain weight. You may have abdominal, leg, and back pain, sleeping problems, and an increased need to urinate.  During the third trimester your breasts will keep growing and they will continue to become tender. A yellow fluid (colostrum) may leak  from your breasts. This is the first milk you are  producing for your baby.  False labor is a condition in which you feel small, irregular tightenings of the muscles in the womb (contractions) that eventually go away. These are called Braxton Hicks contractions. Contractions may last for hours, days, or even weeks before true labor sets in.  Signs of labor can include: abdominal cramps; regular contractions that start at 10 minutes apart and become stronger and more frequent with time; watery or bloody mucus discharge that comes from the vagina; increased pelvic pressure and dull back pain; and leaking of amniotic fluid. This information is not intended to replace advice given to you by your health care provider. Make sure you discuss any questions you have with your health care provider. Document Released: 08/13/2001 Document Revised: 09/24/2016 Document Reviewed: 09/24/2016 Elsevier Interactive Patient Education  2019 [employer].

## 2018-11-06 NOTE — Progress Notes (Signed)
   PRENATAL VISIT NOTE  Subjective:  Madison Allen is a 28 y.o. G1P0 at [redacted]w[redacted]d being seen today for ongoing prenatal care.  She is currently monitored for the following issues for this high-risk pregnancy and has Supervision of high risk pregnancy, antepartum and Chronic hypertension affecting pregnancy on their problem list.  Patient reports no complaints.  Denies  headache, vision changes, or epigastric pain.  Good fetal movement. Contractions: Not present. Vag. Bleeding: None.  Movement: Present. Denies leaking of fluid.   The following portions of the patient's history were reviewed and updated as appropriate: allergies, current medications, past family history, past medical history, past social history, past surgical history and problem list. Problem list updated.  Objective:   Vitals:   11/06/18 1035 11/06/18 1042  BP: (!) 144/80 129/75  Pulse: (!) 103 94  Weight: 208 lb 6.4 oz (94.5 kg)     Fetal Status: Fetal Heart Rate (bpm): 146 Fundal Height: 32 cm Movement: Present     General:  Alert, oriented and cooperative. Patient is in no acute distress.  Skin: Skin is warm and dry. No rash noted.   Cardiovascular: Normal heart rate noted  Respiratory: Normal respiratory effort, no problems with respiration noted  Abdomen: Soft, gravid, appropriate for gestational age.  Pain/Pressure: Absent     Pelvic: Cervical exam deferred        Extremities: Normal range of motion.  Edema: None  Mental Status: Normal mood and affect. Normal behavior. Normal judgment and thought content.   Assessment and Plan:  Pregnancy: G1P0 at [redacted]w[redacted]d  1. Chronic hypertension affecting pregnancy - Begin weekly BPP, add to growth ultrasound next week - Continue ASA - Reviewed signs of preeclampsia  2. Supervision of high risk pregnancy, antepartum - Reviewed signs of labor and new hospital address  Preterm labor symptoms and general obstetric precautions including but not limited to vaginal bleeding,  contractions, leaking of fluid and fetal movement were reviewed in detail with the patient. Please refer to After Visit Summary for other counseling recommendations.  Return in 1 week (on 11/13/2018).  Future Appointments  Date Time Provider [place]  11/13/2018  2:30 PM [place] [facility]  11/20/2018 10:10 AM [place] Leigh N, CNM [facility] None  12/04/2018 11:10 AM Ainsley Spinner, Dara Lords, CNM [facility] None  12/18/2018 11:10 AM Cyndee Brightly, CNM [facility] None  12/25/2018 11:10 AM Laury Deep, CNM [facility] None    Cyndee Brightly, CNM

## 2018-11-13 ENCOUNTER — Ambulatory Visit (HOSPITAL_COMMUNITY)
Admission: RE | Admit: 2018-11-13 | Discharge: 2018-11-13 | Disposition: A | Discharge status: Home / Self Care | Payer: Medicaid Other | Source: Ambulatory Visit | Attending: Family | Admitting: Family

## 2018-11-13 ENCOUNTER — Encounter (HOSPITAL_COMMUNITY): Payer: Self-pay

## 2018-11-13 ENCOUNTER — Other Ambulatory Visit: Payer: Self-pay | Admitting: Family

## 2018-11-13 ENCOUNTER — Ambulatory Visit (HOSPITAL_COMMUNITY): Payer: Medicaid Other | Admitting: *Deleted

## 2018-11-13 ENCOUNTER — Other Ambulatory Visit: Payer: Self-pay

## 2018-11-13 VITALS — BP 128/72 | HR 84 | Wt 208.0 lb

## 2018-11-13 DIAGNOSIS — Z362 Encounter for other antenatal screening follow-up: Secondary | ICD-10-CM

## 2018-11-13 DIAGNOSIS — O10919 Unspecified pre-existing hypertension complicating pregnancy, unspecified trimester: Secondary | ICD-10-CM

## 2018-11-13 DIAGNOSIS — O0990 Supervision of high risk pregnancy, unspecified, unspecified trimester: Secondary | ICD-10-CM

## 2018-11-13 DIAGNOSIS — Z3A31 31 weeks gestation of pregnancy: Secondary | ICD-10-CM | POA: Diagnosis not present

## 2018-11-13 DIAGNOSIS — O10013 Pre-existing essential hypertension complicating pregnancy, third trimester: Secondary | ICD-10-CM | POA: Diagnosis not present

## 2018-11-16 ENCOUNTER — Other Ambulatory Visit (HOSPITAL_COMMUNITY): Payer: Self-pay | Admitting: *Deleted

## 2018-11-16 DIAGNOSIS — O10913 Unspecified pre-existing hypertension complicating pregnancy, third trimester: Secondary | ICD-10-CM

## 2018-11-17 ENCOUNTER — Telehealth: Payer: Self-pay | Admitting: General Practice

## 2018-11-17 NOTE — Telephone Encounter (Signed)
Left message on VM informing pt to limit 1-support person to office visits.  Asked patient to give our office a call with any questions or concerns.  MyChart message sent to patient as well.

## 2018-11-20 ENCOUNTER — Other Ambulatory Visit: Payer: Self-pay

## 2018-11-20 ENCOUNTER — Encounter: Payer: Self-pay | Admitting: General Practice

## 2018-11-20 ENCOUNTER — Ambulatory Visit (INDEPENDENT_AMBULATORY_CARE_PROVIDER_SITE_OTHER): Payer: Medicaid Other | Admitting: Family

## 2018-11-20 VITALS — BP 134/85 | HR 88 | Temp 98.4°F | Wt 207.6 lb

## 2018-11-20 DIAGNOSIS — O10913 Unspecified pre-existing hypertension complicating pregnancy, third trimester: Secondary | ICD-10-CM | POA: Diagnosis not present

## 2018-11-20 DIAGNOSIS — O0990 Supervision of high risk pregnancy, unspecified, unspecified trimester: Secondary | ICD-10-CM

## 2018-11-20 DIAGNOSIS — O10919 Unspecified pre-existing hypertension complicating pregnancy, unspecified trimester: Secondary | ICD-10-CM

## 2018-11-20 DIAGNOSIS — Z3A32 32 weeks gestation of pregnancy: Secondary | ICD-10-CM | POA: Diagnosis not present

## 2018-11-20 NOTE — Patient Instructions (Signed)
Coronavirus (COVID-19) and Pregnancy:  Frequently Asked Questions   How might coronavirus affect my pregnancy? The data for COVID-19 is limited, but we know that women with other coronavirus infections (such as SARS-CoV) did NOT have miscarriage or stillbirth at higher rates than the general population.  On the other hand, we know that having other respiratory viral infections during pregnancy, such as flu, has been associated with problems like low birth weight and preterm birth. Also, having a high fever early in pregnancy may increase the risk of certain birth defects.  Could I transmit coronavirus to my baby during pregnancy or delivery? Among the few case studies of infants born to mothers with COVID-19 published in peer-reviewed literature, none of the infants tested positive for the virus. And there have been no reports of mother-to-baby transmission for other coronaviruses (MERS-CoV and SARS-CoV). Also, there was no virus detected in samples of amniotic fluid or breast milk. But there have been a few reports of newborns as young as a few days old with infection, suggesting that a mother can transmit the infection to her infant through close contact after delivery.  Is it safe for me to deliver at a hospital where there have been COVID-19 cases? It should be. We know that COVID-19 is a very scary virus. The good news is that hospitals are taking great precautions to keep patients and healthcare providers safe.  According to the CDC guidelines, when a patient is even suspected to have COVID-19, they should be placed in a negative pressure room. (Think of these rooms as vacuums that suck and filter the air so it's safe for the other people in the hospital.) If there are no rooms available, these patients should be asked to wait at home until they can be accomodated safely. This should make it possible for you to deliver at the hospital without putting you or your baby at risk.  Hospitals are  also implementing stricter visiting policies to keep patients safe. It's worth calling your hospital to check if there are any new regulations to be aware of.  What plans should I make now in case the hospital system is overwhelmed when it's time for me to deliver? Every hospital is making different plans for dealing with this scenario. Talk with your doctor or midwife once you're at least [redacted] weeks pregnant. I work in [employer].   Should I ask my doctor to excuse me from work until the baby is born? What if I work in a school, the travel [place], or some other high-risk setting? Healthcare facilities should take care to limit the exposure of pregnant employees to patients with confirmed or suspected COVID-19, just as they would with other infectious cases. If you continue working, be sure to follow the CDC's risk assessment and infection control guidelines. If you work in a school, travel [place], or other high-risk setting, talk with your employer about what it's doing to protect employees and minimize infection risks. Wash your hands often. advertisement  page continues below  What if my OB gets COVID-19? If your doctor or midwife tests positive for COVID-19, they will need to be quarantined until they recover and are no longer at risk of transmitting the virus. In this case, you'll be assigned to another OB in your doctor's practice (or you may choose another practitioner yourself). Ask your new OB or your doctor's office if you should self-quarantine or be tested for the virus. (It will depend on when you last saw  your provider and when that person tested positive.)  Should we hold off on trying to conceive because of COVID-19? At this time, there's no reason to hold off on trying to get pregnant, but the data we have is really limited. For example, we don't think the virus causes birth defects or increases your risk of miscarriage. But we don't know for sure whether you could transmit  COVID-19 to your baby before or during delivery. We also don't know if the virus lives in semen or can be sexually transmitted.  We have a babymoon scheduled in the next few months - should we cancel? Yes. At this time, the virus has reached more than 140 countries, and there are travel bans to [place], most of [place], and [place]. Places where large numbers of people gather are at highest risk, especially airports and cruise ships.  If you were planning travel in the [place] note that any travel setting increases your risk of exposure, and there are already many places where everyone is being asked to stay home. To see how the virus is spreading, check The [place] Times map based on CDC data.  For the most current advice to help you avoid exposure, check the CDC's COVID-19 travel page.  Will the hospital separate me from my newborn and keep the baby in quarantine? If you don't have COVID-19 and have not been exposed to the virus, the hospital will not separate you from your newborn. If you do test positive for COVID-19 or have been exposed but have no symptoms, the CDC, [employer]nec[employer], and the Society for [employer] all recommend that you be separated from your baby to decrease the risk of transmission to the baby. This would last until you are no longer at risk of transmitting the virus.  This scenario would, of course, be beyond heartbreaking. Talk to the hospital, your baby's pediatrician, and your family about how to plan for care of your baby in the event that you have to be separated after delivery. And try to make sure you have the emotional support you would need to endure the sadness and stress of having to potentially wait weeks to meet your newborn.   My hospital is restricting visitors and only allowing one support person. If my support person leaves after the delivery, will they be allowed to come back? Every hospital has different policies.  Contact your hospital or labor and delivery unit a week or so before delivery to get the most up-to-date restrictions. In general, if your support person needs to leave, they would be allowed back unless they knew they were exposed to COVID-19 after leaving your company.  My mom was planning to fly here to help me care for my new baby after delivery. Should I tell her not to come? Yes. If your mom is over 37 or has any serious chronic medical conditions (such as heart disease, lung disease, or diabetes), she is at higher risk of serious illness from COVID-19 and should avoid air travel. And remember that any travel setting increases a person's risk of exposure. So, it may be risky to have her around the baby after she has been traveling. For the most current advice on traveling, check the CDC's COVID-19 travel page.

## 2018-11-20 NOTE — Progress Notes (Signed)
   PRENATAL VISIT NOTE  Subjective:  Madison Allen is a 28 y.o. G1P0 at [redacted]w[redacted]d being seen today for ongoing prenatal care.  She is currently monitored for the following issues for this high-risk pregnancy and has Supervision of high risk pregnancy, antepartum and Chronic hypertension affecting pregnancy on their problem list.  Patient reports no complaints.  Contractions: Not present. Vag. Bleeding: None.  Movement: Present. Denies leaking of fluid.   The following portions of the patient's history were reviewed and updated as appropriate: allergies, current medications, past family history, past medical history, past social history, past surgical history and problem list.   Objective:   Vitals:   11/20/18 1018  BP: 134/85  Pulse: 88  Temp: 98.4 F (36.9 C)  Weight: 207 lb 9.6 oz (94.2 kg)    Fetal Status: Fetal Heart Rate (bpm): 146 Fundal Height: 33 cm Movement: Present   NST:  Reactive (see flow sheet)  General:  Alert, oriented and cooperative. Patient is in no acute distress.  Skin: Skin is warm and dry. No rash noted.   Cardiovascular: Normal heart rate noted  Respiratory: Normal respiratory effort, no problems with respiration noted  Abdomen: Soft, gravid, appropriate for gestational age.  Pain/Pressure: Absent     Pelvic: Cervical exam deferred        Extremities: Normal range of motion.  Edema: None  Mental Status: Normal mood and affect. Normal behavior. Normal judgment and thought content.   Assessment and Plan:  Pregnancy: G1P0 at [redacted]w[redacted]d 1. Supervision of high risk pregnancy, antepartum - Reviewed Coronavirus information; consents to Telehealth visits  2. Chronic hypertension affecting pregnancy - [place] MFM FETAL BPP W/NONSTRESS; Standing - Continue ASA - NST today  Preterm labor symptoms and general obstetric precautions including but not limited to vaginal bleeding, contractions, leaking of fluid and fetal movement were reviewed in detail with the patient. Please  refer to After Visit Summary for other counseling recommendations.   Return in about 2 weeks (around 12/04/2018) for Telehealth Visit - [facility].  Future Appointments  Date Time Provider [place]  11/23/2018  3:45 PM [place] 2 WH-MFCUS MFC-US  12/04/2018 11:10 AM [place] Leigh N, CNM [facility] None  12/11/2018  3:00 PM [place] MFC-US  12/11/2018  3:00 PM [place] 3 WH-MFCUS MFC-US  12/18/2018 11:10 AM [place], Dara Lords, CNM [facility] None  12/18/2018  2:15 PM [place] NURSE [place] MFC-US  12/18/2018  2:15 PM [place] 4 WH-MFCUS MFC-US  12/25/2018 11:10 AM Laury Deep, CNM [facility] None    Cyndee Brightly, CNM

## 2018-11-23 ENCOUNTER — Ambulatory Visit (HOSPITAL_COMMUNITY)
Admission: RE | Admit: 2018-11-23 | Discharge: 2018-11-23 | Disposition: A | Discharge status: Home / Self Care | Payer: Medicaid Other | Source: Ambulatory Visit | Attending: Family | Admitting: Family

## 2018-11-23 ENCOUNTER — Other Ambulatory Visit: Payer: Self-pay

## 2018-11-23 DIAGNOSIS — O10913 Unspecified pre-existing hypertension complicating pregnancy, third trimester: Secondary | ICD-10-CM | POA: Diagnosis not present

## 2018-11-23 DIAGNOSIS — Z3A32 32 weeks gestation of pregnancy: Secondary | ICD-10-CM

## 2018-11-23 DIAGNOSIS — O10919 Unspecified pre-existing hypertension complicating pregnancy, unspecified trimester: Secondary | ICD-10-CM | POA: Diagnosis present

## 2018-11-23 DIAGNOSIS — O289 Unspecified abnormal findings on antenatal screening of mother: Secondary | ICD-10-CM | POA: Diagnosis not present

## 2018-12-04 ENCOUNTER — Ambulatory Visit (INDEPENDENT_AMBULATORY_CARE_PROVIDER_SITE_OTHER): Payer: Medicaid Other | Admitting: Family

## 2018-12-04 ENCOUNTER — Other Ambulatory Visit: Payer: Self-pay

## 2018-12-04 VITALS — BP 131/78 | HR 80 | Temp 97.9°F | Wt 209.2 lb

## 2018-12-04 DIAGNOSIS — O0993 Supervision of high risk pregnancy, unspecified, third trimester: Secondary | ICD-10-CM

## 2018-12-04 DIAGNOSIS — Z3A34 34 weeks gestation of pregnancy: Secondary | ICD-10-CM

## 2018-12-07 ENCOUNTER — Encounter: Payer: Self-pay | Admitting: General Practice

## 2018-12-11 ENCOUNTER — Ambulatory Visit (HOSPITAL_COMMUNITY)
Admission: RE | Admit: 2018-12-11 | Discharge: 2018-12-11 | Disposition: A | Discharge status: Home / Self Care | Payer: Medicaid Other | Source: Ambulatory Visit | Attending: Obstetrics and Gynecology | Admitting: Obstetrics and Gynecology

## 2018-12-11 ENCOUNTER — Ambulatory Visit (HOSPITAL_COMMUNITY): Payer: Medicaid Other | Admitting: *Deleted

## 2018-12-11 ENCOUNTER — Other Ambulatory Visit: Payer: Self-pay

## 2018-12-11 ENCOUNTER — Encounter (HOSPITAL_COMMUNITY): Payer: Self-pay

## 2018-12-11 VITALS — BP 133/79 | HR 88 | Temp 98.5°F | Wt 213.1 lb

## 2018-12-11 DIAGNOSIS — O99213 Obesity complicating pregnancy, third trimester: Secondary | ICD-10-CM

## 2018-12-11 DIAGNOSIS — O10919 Unspecified pre-existing hypertension complicating pregnancy, unspecified trimester: Secondary | ICD-10-CM | POA: Diagnosis present

## 2018-12-11 DIAGNOSIS — Z362 Encounter for other antenatal screening follow-up: Secondary | ICD-10-CM

## 2018-12-11 DIAGNOSIS — O10019 Pre-existing essential hypertension complicating pregnancy, unspecified trimester: Secondary | ICD-10-CM

## 2018-12-11 DIAGNOSIS — Z3A35 35 weeks gestation of pregnancy: Secondary | ICD-10-CM | POA: Diagnosis not present

## 2018-12-11 DIAGNOSIS — O10913 Unspecified pre-existing hypertension complicating pregnancy, third trimester: Secondary | ICD-10-CM | POA: Insufficient documentation

## 2018-12-11 DIAGNOSIS — O0990 Supervision of high risk pregnancy, unspecified, unspecified trimester: Secondary | ICD-10-CM | POA: Diagnosis present

## 2018-12-14 ENCOUNTER — Telehealth: Payer: Self-pay | Admitting: General Practice

## 2018-12-14 NOTE — Telephone Encounter (Signed)
Pt aware of change of appointment.

## 2018-12-16 ENCOUNTER — Other Ambulatory Visit: Payer: Self-pay

## 2018-12-16 ENCOUNTER — Encounter: Payer: Self-pay | Admitting: Obstetrics and Gynecology

## 2018-12-16 ENCOUNTER — Ambulatory Visit (INDEPENDENT_AMBULATORY_CARE_PROVIDER_SITE_OTHER): Payer: Medicaid Other | Admitting: Obstetrics and Gynecology

## 2018-12-16 VITALS — BP 133/83 | HR 79

## 2018-12-16 DIAGNOSIS — Z3A36 36 weeks gestation of pregnancy: Secondary | ICD-10-CM

## 2018-12-16 DIAGNOSIS — O10913 Unspecified pre-existing hypertension complicating pregnancy, third trimester: Secondary | ICD-10-CM | POA: Diagnosis not present

## 2018-12-16 DIAGNOSIS — O0990 Supervision of high risk pregnancy, unspecified, unspecified trimester: Secondary | ICD-10-CM

## 2018-12-16 DIAGNOSIS — O10919 Unspecified pre-existing hypertension complicating pregnancy, unspecified trimester: Secondary | ICD-10-CM

## 2018-12-16 NOTE — Progress Notes (Signed)
   PRENATAL VISIT NOTE  Subjective:  Madison Allen is a 28 y.o. G1P0 at 34.3 wks being seen today for ongoing prenatal care.  She is currently monitored for the following issues for this high-risk pregnancy and has Supervision of high risk pregnancy, antepartum and Chronic hypertension affecting pregnancy on their problem list.  Patient reports no complaints.  Contractions: Not present. Vag. Bleeding: None.  Movement: Present. Denies leaking of fluid.   The following portions of the patient's history were reviewed and updated as appropriate: allergies, current medications, past family history, past medical history, past social history, past surgical history and problem list.   Objective:   Vitals:   12/04/18 1123  BP: 131/78  Pulse: 80  Temp: 97.9 F (36.6 C)  Weight: 209 lb 3.2 oz (94.9 kg)    Fetal Status: Fetal Heart Rate (bpm): 144 Fundal Height: 35 cm Movement: Present     General:  Alert, oriented and cooperative. Patient is in no acute distress.  Skin: Skin is warm and dry. No rash noted.   Cardiovascular: Normal heart rate noted  Respiratory: Normal respiratory effort, no problems with respiration noted  Abdomen: Soft, gravid, appropriate for gestational age.  Pain/Pressure: Absent     Pelvic: Cervical exam deferred        Extremities: Normal range of motion.  Edema: None  Mental Status: Normal mood and affect. Normal behavior. Normal judgment and thought content.   Assessment and Plan:  Pregnancy: G1P0 at [redacted]w[redacted]d Chronic Hypertension in Pregnancy 1.  Continue ASA 2.  Growth US/BPP 12/11/18 3.  Begin weekly BPP at 36 weeks  Preterm labor symptoms and general obstetric precautions including but not limited to vaginal bleeding, contractions, leaking of fluid and fetal movement were reviewed in detail with the patient. Please refer to After Visit Summary for other counseling recommendations.   Return in about 2 weeks (around 12/18/2018) for Telehealth Visit - [facility].   Future Appointments  Date Time Provider [place]  12/18/2018  2:15 PM [place] NURSE [place] MFC-US  12/18/2018  2:15 PM [place] 4 WH-MFCUS MFC-US  12/25/2018 11:10 AM Laury Deep, CNM [facility] None    Cyndee Brightly, CNM

## 2018-12-16 NOTE — Patient Instructions (Signed)
.  Preeclampsia and Eclampsia    Preeclampsia is a serious condition that may develop during pregnancy. It is also called toxemia of pregnancy. This condition causes high blood pressure along with other symptoms, such as swelling and headaches. These symptoms may develop as the condition gets worse. Preeclampsia may occur at 20 weeks of pregnancy or later.  Diagnosing and treating preeclampsia early is very important. If not treated early, it can cause serious problems for you and your baby. One problem it can lead to is eclampsia. Eclampsia is a condition that causes muscle jerking or shaking (convulsions or seizures) and other serious problems for the mother. During pregnancy, delivering your baby may be the best treatment for preeclampsia or eclampsia. For most women, preeclampsia and eclampsia symptoms go away after giving birth.  In rare cases, a woman may develop preeclampsia after giving birth (postpartum preeclampsia). This usually occurs within 48 hours after childbirth but may occur up to 6 weeks after giving birth.  What are the causes?  The cause of preeclampsia is not known.  What increases the risk?  The following risk factors make you more likely to develop preeclampsia:   Being pregnant for the first time.   Having had preeclampsia during a past pregnancy.   Having a family history of preeclampsia.   Having high blood pressure.   Being pregnant with more than one baby.   Being 35 or older.   Being African-American.   Having kidney disease or diabetes.   Having medical conditions such as lupus or blood diseases.   Being very overweight (obese).  What are the signs or symptoms?  The earliest signs of preeclampsia are:   High blood pressure.   Increased protein in your urine. Your health care provider will check for this at every visit before you give birth (prenatal visit).  Other symptoms that may develop as the condition gets worse include:   Severe headaches.   Sudden weight  gain.   Swelling of the hands, face, legs, and feet.   Nausea and vomiting.   Vision problems, such as blurred or double vision.   Numbness in the face, arms, legs, and feet.   Urinating less than usual.   Dizziness.   Slurred speech.   Abdominal pain, especially upper abdominal pain.   Convulsions or seizures.  How is this diagnosed?  There are no screening tests for preeclampsia. Your health care provider will ask you about symptoms and check for signs of preeclampsia during your prenatal visits. You may also have tests that include:   Urine tests.   Blood tests.   Checking your blood pressure.   Monitoring your baby's heart rate.   Ultrasound.  How is this treated?  You and your health care provider will determine the treatment approach that is best for you. Treatment may include:   Having more frequent prenatal exams to check for signs of preeclampsia, if you have an increased risk for preeclampsia.   Medicine to lower your blood pressure.   Staying in the hospital, if your condition is severe. There, treatment will focus on controlling your blood pressure and the amount of fluids in your body (fluid retention).   Taking medicine (magnesium sulfate) to prevent seizures. This may be given as an injection or through an IV.   Taking a low-dose aspirin during your pregnancy.   Delivering your baby early, if your condition gets worse. You may have your labor started with medicine (induced), or you may have a cesarean   delivery.  Follow these instructions at home:  Eating and drinking     Drink enough fluid to keep your urine pale yellow.   Avoid caffeine.  Lifestyle   Do not use any products that contain nicotine or tobacco, such as cigarettes and e-cigarettes. If you need help quitting, ask your health care provider.   Do not use alcohol or drugs.   Avoid stress as much as possible. Rest and get plenty of sleep.  General instructions   Take over-the-counter and prescription medicines only as  told by your health care provider.   When lying down, lie on your left side. This keeps pressure off your major blood vessels.   When sitting or lying down, raise (elevate) your feet. Try putting some pillows underneath your lower legs.   Exercise regularly. Ask your health care provider what kinds of exercise are best for you.   Keep all follow-up and prenatal visits as told by your health care provider. This is important.  How is this prevented?  There is no known way of preventing preeclampsia or eclampsia from developing. However, to lower your risk of complications and detect problems early:   Get regular prenatal care. Your health care provider may be able to diagnose and treat the condition early.   Maintain a healthy weight. Ask your health care provider for help managing weight gain during pregnancy.   Work with your health care provider to manage any long-term (chronic) health conditions you have, such as diabetes or kidney problems.   You may have tests of your blood pressure and kidney function after giving birth.   Your health care provider may have you take low-dose aspirin during your next pregnancy.  Contact a health care provider if:   You have symptoms that your health care provider told you may require more treatment or monitoring, such as:  ? Headaches.  ? Nausea or vomiting.  ? Abdominal pain.  ? Dizziness.  ? Light-headedness.  Get help right away if:   You have severe:  ? Abdominal pain.  ? Headaches that do not get better.  ? Dizziness.  ? Vision problems.  ? Confusion.  ? Nausea or vomiting.   You have any of the following:  ? A seizure.  ? Sudden, rapid weight gain.  ? Sudden swelling in your hands, ankles, or face.  ? Trouble moving any part of your body.  ? Numbness in any part of your body.  ? Trouble speaking.  ? Abnormal bleeding.   You faint.  Summary   Preeclampsia is a serious condition that may develop during pregnancy. It is also called toxemia of pregnancy.   This  condition causes high blood pressure along with other symptoms, such as swelling and headaches.   Diagnosing and treating preeclampsia early is very important. If not treated early, it can cause serious problems for you and your baby.   Get help right away if you have symptoms that your health care provider told you to watch for.  This information is not intended to replace advice given to you by your health care provider. Make sure you discuss any questions you have with your health care provider.  Document Released: 08/16/2000 Document Revised: 08/05/2017 Document Reviewed: 03/25/2016  Elsevier Interactive Patient Education  2019 [employer]

## 2018-12-16 NOTE — Progress Notes (Signed)
   TELEHEALTH VIRTUAL OBSTETRICS VISIT ENCOUNTER NOTE  I connected with Madison Allen on 12/16/18 at 10:00 AM EDT by telephone at home and verified that I am speaking with the correct person using two identifiers.   I discussed the limitations, risks, security and privacy concerns of performing an evaluation and management service by telephone and the availability of in person appointments. I also discussed with the patient that there may be a patient responsible charge related to this service. The patient expressed understanding and agreed to proceed.  Subjective:  Madison Allen is a 28 y.o. G1P0 at [redacted]w[redacted]d being followed for ongoing prenatal care.  She is currently monitored for the following issues for this high-risk pregnancy and has Supervision of high risk pregnancy, antepartum and Chronic hypertension affecting pregnancy on their problem list.  Patient reports no complaints. Reports fetal movement. Denies any contractions, bleeding or leaking of fluid.   The following portions of the patient's history were reviewed and updated as appropriate: allergies, current medications, past family history, past medical history, past social history, past surgical history and problem list.  BP 149/97 Pulse 85  133/83   Pulse 79   LMP 04/07/2018 (Exact Date)   Objective:   General:  Alert, oriented and cooperative.   Mental Status: Normal mood and affect perceived. Normal judgment and thought content.  Rest of physical exam deferred due to type of encounter  Assessment and Plan:  Pregnancy: G1P0 at [redacted]w[redacted]d Chronic hypertension affecting pregnancy - Daily bASA - Retake BP and send via MyChart message - Repeated BP= 133/93 - Advised to take BP daily and call for >140/90  Supervision of high risk pregnancy, antepartum   Preterm labor symptoms and general obstetric precautions including but not limited to vaginal bleeding, contractions, leaking of fluid and fetal movement were reviewed in detail  with the patient.  I discussed the assessment and treatment plan with the patient. The patient was provided an opportunity to ask questions and all were answered. The patient agreed with the plan and demonstrated an understanding of the instructions. The patient was advised to call back or seek an in-person office evaluation/go to [facility] at [facility] for any urgent or concerning symptoms. Please refer to After Visit Summary for other counseling recommendations.   I provided 10 minutes of non-face-to-face time during this encounter.  No follow-ups on file.  Future Appointments  Date Time Provider [place]  12/18/2018  2:15 PM [place] NURSE [place] MFC-US  12/18/2018  2:15 PM [place] 4 WH-MFCUS MFC-US  12/25/2018 11:10 AM Laury Deep, CNM [facility]EN None    Laury Deep, [place] for [employer], [place]

## 2018-12-17 ENCOUNTER — Encounter: Payer: Medicaid Other | Admitting: Obstetrics and Gynecology

## 2018-12-18 ENCOUNTER — Other Ambulatory Visit: Payer: Self-pay

## 2018-12-18 ENCOUNTER — Ambulatory Visit (HOSPITAL_COMMUNITY)
Admission: RE | Admit: 2018-12-18 | Discharge: 2018-12-18 | Disposition: A | Discharge status: Home / Self Care | Payer: Medicaid Other | Source: Ambulatory Visit | Attending: Obstetrics and Gynecology | Admitting: Obstetrics and Gynecology

## 2018-12-18 ENCOUNTER — Encounter: Payer: Medicaid Other | Admitting: Family

## 2018-12-18 ENCOUNTER — Encounter (HOSPITAL_COMMUNITY): Payer: Self-pay

## 2018-12-18 ENCOUNTER — Ambulatory Visit (HOSPITAL_COMMUNITY): Payer: Medicaid Other | Admitting: *Deleted

## 2018-12-18 VITALS — BP 133/75 | HR 106 | Temp 99.0°F | Wt 210.1 lb

## 2018-12-18 DIAGNOSIS — O10919 Unspecified pre-existing hypertension complicating pregnancy, unspecified trimester: Secondary | ICD-10-CM

## 2018-12-18 DIAGNOSIS — O0990 Supervision of high risk pregnancy, unspecified, unspecified trimester: Secondary | ICD-10-CM | POA: Diagnosis present

## 2018-12-18 DIAGNOSIS — O10013 Pre-existing essential hypertension complicating pregnancy, third trimester: Secondary | ICD-10-CM

## 2018-12-18 DIAGNOSIS — Z3A36 36 weeks gestation of pregnancy: Secondary | ICD-10-CM

## 2018-12-18 DIAGNOSIS — O99213 Obesity complicating pregnancy, third trimester: Secondary | ICD-10-CM | POA: Diagnosis not present

## 2018-12-18 NOTE — Progress Notes (Signed)
1 

## 2018-12-21 ENCOUNTER — Other Ambulatory Visit (HOSPITAL_COMMUNITY): Payer: Self-pay | Admitting: *Deleted

## 2018-12-21 DIAGNOSIS — O10913 Unspecified pre-existing hypertension complicating pregnancy, third trimester: Secondary | ICD-10-CM

## 2018-12-25 ENCOUNTER — Encounter: Payer: Self-pay | Admitting: Obstetrics and Gynecology

## 2018-12-25 ENCOUNTER — Other Ambulatory Visit (HOSPITAL_COMMUNITY)
Admission: RE | Admit: 2018-12-25 | Discharge: 2018-12-25 | Disposition: A | Discharge status: Home / Self Care | Payer: Medicaid Other | Source: Ambulatory Visit | Attending: Obstetrics and Gynecology | Admitting: Obstetrics and Gynecology

## 2018-12-25 ENCOUNTER — Other Ambulatory Visit: Payer: Self-pay

## 2018-12-25 ENCOUNTER — Ambulatory Visit (INDEPENDENT_AMBULATORY_CARE_PROVIDER_SITE_OTHER): Payer: Medicaid Other | Admitting: Obstetrics and Gynecology

## 2018-12-25 VITALS — BP 135/76 | HR 99 | Wt 213.0 lb

## 2018-12-25 DIAGNOSIS — O99891 Other specified diseases and conditions complicating pregnancy: Secondary | ICD-10-CM

## 2018-12-25 DIAGNOSIS — O10913 Unspecified pre-existing hypertension complicating pregnancy, third trimester: Secondary | ICD-10-CM

## 2018-12-25 DIAGNOSIS — M549 Dorsalgia, unspecified: Secondary | ICD-10-CM

## 2018-12-25 DIAGNOSIS — O0990 Supervision of high risk pregnancy, unspecified, unspecified trimester: Secondary | ICD-10-CM | POA: Insufficient documentation

## 2018-12-25 DIAGNOSIS — O10919 Unspecified pre-existing hypertension complicating pregnancy, unspecified trimester: Secondary | ICD-10-CM

## 2018-12-25 DIAGNOSIS — Z3A37 37 weeks gestation of pregnancy: Secondary | ICD-10-CM

## 2018-12-25 DIAGNOSIS — O9989 Other specified diseases and conditions complicating pregnancy, childbirth and the puerperium: Secondary | ICD-10-CM

## 2018-12-25 MED ORDER — COMFORT FIT MATERNITY SUPP SM MISC: 1.0000 [IU] | Freq: Every day | 0 refills | Status: DC | PRN

## 2018-12-25 NOTE — Patient Instructions (Signed)
Hypertension During Pregnancy ° °Hypertension, commonly called high blood pressure, is when the force of blood pumping through your arteries is too strong. Arteries are blood vessels that carry blood from the heart throughout the body. Hypertension during pregnancy can cause problems for you and your baby. Your baby may be born early (prematurely) or may not weigh as much as he or she should at birth. Very bad cases of hypertension during pregnancy can be life-threatening. °Different types of hypertension can occur during pregnancy. These include: °· Chronic hypertension. This happens when: °? You have hypertension before pregnancy and it continues during pregnancy. °? You develop hypertension before you are [redacted] weeks pregnant, and it continues during pregnancy. °· Gestational hypertension. This is hypertension that develops after the 20th week of pregnancy. °· Preeclampsia, also called toxemia of pregnancy. This is a very serious type of hypertension that develops during pregnancy. It can be very dangerous for you and your baby. °? In rare cases, you may develop preeclampsia after giving birth (postpartum preeclampsia). This usually occurs within 48 hours after childbirth but may occur up to 6 weeks after giving birth. °Gestational hypertension and preeclampsia usually go away within 6 weeks after your baby is born. Women who have hypertension during pregnancy have a greater chance of developing hypertension later in life or during future pregnancies. °What are the causes? °The exact cause of hypertension during pregnancy is not known. °What increases the risk? °There are certain factors that make it more likely for you to develop hypertension during pregnancy. These include: °· Having hypertension during a previous pregnancy or prior to pregnancy. °· Being overweight. °· Being age 35 or older. °· Being pregnant for the first time. °· Being pregnant with more than one baby. °· Becoming pregnant using fertilization  methods such as IVF (in vitro fertilization). °· Having diabetes, kidney problems, or systemic lupus erythematosus. °· Having a family history of hypertension. °What are the signs or symptoms? °Chronic hypertension and gestational hypertension rarely cause symptoms. Preeclampsia causes symptoms, which may include: °· Increased protein in your urine. Your health care provider will check for this at every visit before you give birth (prenatal visit). °· Severe headaches. °· Sudden weight gain. °· Swelling of the hands, face, legs, and feet. °· Nausea and vomiting. °· Vision problems, such as blurred or double vision. °· Numbness in the face, arms, legs, and feet. °· Dizziness. °· Slurred speech. °· Sensitivity to bright lights. °· Abdominal pain. °· Convulsions or seizures. °How is this diagnosed? °You may be diagnosed with hypertension during a routine prenatal exam. At each prenatal visit, you may: °· Have a urine test to check for high amounts of protein in your urine. °· Have your blood pressure checked. A blood pressure reading is given as two numbers, such as "120 over 80" (or 120/80). The first ("top") number is a measure of the pressure in your arteries when your heart beats (systolic pressure). The second ("bottom") number is a measure of the pressure in your arteries as your heart relaxes between beats (diastolic pressure). Blood pressure is measured in a unit called mm Hg. For most women, a normal blood pressure reading is: °? Systolic: below 120. °? Diastolic: below 80. °The type of hypertension that you are diagnosed with depends on your test results and when your symptoms developed. °· Chronic hypertension is usually diagnosed before 20 weeks of pregnancy. °· Gestational hypertension is usually diagnosed after 20 weeks of pregnancy. °· Hypertension with high amounts of protein in   the urine is diagnosed as preeclampsia. °· Blood pressure measurements that stay above 160 systolic, or above 110 diastolic,  are signs of severe preeclampsia. °How is this treated? °Treatment for hypertension during pregnancy varies depending on the type of hypertension you have and how serious it is. °· If you take medicines called ACE inhibitors to treat chronic hypertension, you may need to switch medicines. ACE inhibitors should not be taken during pregnancy. °· If you have gestational hypertension, you may need to take blood pressure medicine. °· If you are at risk for preeclampsia, your health care provider may recommend that you take a low-dose aspirin during your pregnancy. °· If you have severe preeclampsia, you may need to be hospitalized so you and your baby can be monitored closely. You may also need to take medicine (magnesium sulfate) to prevent seizures and to lower blood pressure. This medicine may be given as an injection or through an IV. °· In some cases, if your condition gets worse, you may need to deliver your baby early. °Follow these instructions at home: °Eating and drinking ° °· Drink enough fluid to keep your urine pale yellow. °· Avoid caffeine. °Lifestyle °· Do not use any products that contain nicotine or tobacco, such as cigarettes and e-cigarettes. If you need help quitting, ask your health care provider. °· Do not use alcohol or drugs. °· Avoid stress as much as possible. Rest and get plenty of sleep. °General instructions °· Take over-the-counter and prescription medicines only as told by your health care provider. °· While lying down, lie on your left side. This keeps pressure off your major blood vessels. °· While sitting or lying down, raise (elevate) your feet. Try putting some pillows under your lower legs. °· Exercise regularly. Ask your health care provider what kinds of exercise are best for you. °· Keep all prenatal and follow-up visits as told by your health care provider. This is important. °Contact a health care provider if: °· You have symptoms that your health care provider told you may  require more treatment or monitoring, such as: °? Nausea or vomiting. °? Headache. °Get help right away if you have: °· Severe abdominal pain that does not get better with treatment. °· A severe headache that does not get better. °· Vomiting that does not get better. °· Sudden, rapid weight gain. °· Sudden swelling in your hands, ankles, or face. °· Vaginal bleeding. °· Blood in your urine. °· Fewer movements from your baby than usual. °· Blurred or double vision. °· Muscle twitching or sudden muscle tightening (spasms). °· Shortness of breath. °· Blue fingernails or lips. °Summary °· Hypertension, commonly called high blood pressure, is when the force of blood pumping through your arteries is too strong. °· Hypertension during pregnancy can cause problems for you and your baby. °· Treatment for hypertension during pregnancy varies depending on the type of hypertension you have and how serious it is. °· Get help right away if you have symptoms that your health care provider told you to watch for. °This information is not intended to replace advice given to you by your health care provider. Make sure you discuss any questions you have with your health care provider. °Document Released: 05/07/2011 Document Revised: 08/05/2017 Document Reviewed: 02/02/2016 °Elsevier Interactive Patient Education © 2019 [employer] ° °

## 2018-12-25 NOTE — Progress Notes (Signed)
   PRENATAL VISIT NOTE  Subjective:  Madison Allen is a 28 y.o. G1P0 at [redacted]w[redacted]d being seen today for ongoing prenatal care.  She is currently monitored for the following issues for this high-risk pregnancy and has Supervision of high risk pregnancy, antepartum and Chronic hypertension affecting pregnancy on their problem list.  Patient reports backache and pelvic pain.  Contractions: Not present. Vag. Bleeding: None.  Movement: Absent. Denies leaking of fluid.   The following portions of the patient's history were reviewed and updated as appropriate: allergies, current medications, past family history, past medical history, past social history, past surgical history and problem list.   Objective:   Vitals:   12/25/18 1120 12/25/18 1129  BP: (!) 148/92 135/76  Pulse: 96 99  Weight: 213 lb (96.6 kg)     Fetal Status: Fetal Heart Rate (bpm): 141 Fundal Height: 39 cm Movement: Absent  Presentation: Vertex  General:  Alert, oriented and cooperative. Patient is in no acute distress.  Skin: Skin is warm and dry. No rash noted.   Cardiovascular: Normal heart rate noted  Respiratory: Normal respiratory effort, no problems with respiration noted  Abdomen: Soft, gravid, appropriate for gestational age.  Pain/Pressure: Absent     Pelvic: Cervical exam performed Dilation: Closed Effacement (%): Thick Station: Ballotable  Extremities: Normal range of motion.  Edema: None  Mental Status: Normal mood and affect. Normal behavior. Normal judgment and thought content.   Assessment and Plan:  Pregnancy: G1P0 at [redacted]w[redacted]d 1. Back pain affecting pregnancy in third trimester - Elastic Bandages & Supports (COMFORT FIT MATERNITY SUPP SM) MISC; 1 Units by Does not apply route daily as needed.  Dispense: 1 each; Refill: 0  2. Supervision of high risk pregnancy, antepartum - Culture, beta strep (group b only) - Cervicovaginal ancillary only( [place])  3. Chronic hypertension affecting pregnancy -  Discussed need for IOL at 40 wks, if not delivered  Term labor symptoms and general obstetric precautions including but not limited to vaginal bleeding, contractions, leaking of fluid and fetal movement were reviewed in detail with the patient. Please refer to After Visit Summary for other counseling recommendations.   Return in about 1 week (around 01/01/2019) for Return OB - webex/telehealth.  Future Appointments  Date Time Provider [place]  12/31/2018 11:00 AM Laury Deep, CNM [facility] None  01/01/2019  3:15 PM [place] NURSE [place] [facility]  01/01/2019  3:15 PM [place]    Laury Deep, CNM

## 2018-12-29 LAB — CULTURE, BETA STREP (GROUP B ONLY): Strep Gp B Culture: NEGATIVE

## 2018-12-30 LAB — CERVICOVAGINAL ANCILLARY ONLY
Chlamydia: NEGATIVE
Neisseria Gonorrhea: NEGATIVE

## 2018-12-31 ENCOUNTER — Encounter: Payer: Medicaid Other | Admitting: Obstetrics and Gynecology

## 2019-01-01 ENCOUNTER — Other Ambulatory Visit: Payer: Self-pay

## 2019-01-01 ENCOUNTER — Ambulatory Visit (HOSPITAL_COMMUNITY)
Admission: RE | Admit: 2019-01-01 | Discharge: 2019-01-01 | Disposition: A | Discharge status: Home / Self Care | Payer: Medicaid Other | Source: Ambulatory Visit | Attending: Obstetrics and Gynecology | Admitting: Obstetrics and Gynecology

## 2019-01-01 ENCOUNTER — Ambulatory Visit (HOSPITAL_COMMUNITY): Payer: Medicaid Other | Admitting: *Deleted

## 2019-01-01 ENCOUNTER — Other Ambulatory Visit (HOSPITAL_COMMUNITY): Payer: Self-pay | Admitting: *Deleted

## 2019-01-01 DIAGNOSIS — O10013 Pre-existing essential hypertension complicating pregnancy, third trimester: Secondary | ICD-10-CM

## 2019-01-01 DIAGNOSIS — O99213 Obesity complicating pregnancy, third trimester: Secondary | ICD-10-CM | POA: Diagnosis not present

## 2019-01-01 DIAGNOSIS — O10913 Unspecified pre-existing hypertension complicating pregnancy, third trimester: Secondary | ICD-10-CM | POA: Insufficient documentation

## 2019-01-01 DIAGNOSIS — O10919 Unspecified pre-existing hypertension complicating pregnancy, unspecified trimester: Secondary | ICD-10-CM

## 2019-01-01 DIAGNOSIS — O0990 Supervision of high risk pregnancy, unspecified, unspecified trimester: Secondary | ICD-10-CM | POA: Insufficient documentation

## 2019-01-01 DIAGNOSIS — Z3A38 38 weeks gestation of pregnancy: Secondary | ICD-10-CM | POA: Diagnosis not present

## 2019-01-07 ENCOUNTER — Ambulatory Visit (INDEPENDENT_AMBULATORY_CARE_PROVIDER_SITE_OTHER): Payer: Medicaid Other | Admitting: Obstetrics and Gynecology

## 2019-01-07 ENCOUNTER — Encounter: Payer: Self-pay | Admitting: Obstetrics and Gynecology

## 2019-01-07 ENCOUNTER — Ambulatory Visit (HOSPITAL_COMMUNITY)
Admission: RE | Admit: 2019-01-07 | Discharge: 2019-01-07 | Disposition: A | Discharge status: Home / Self Care | Payer: Medicaid Other | Source: Ambulatory Visit | Attending: Obstetrics and Gynecology | Admitting: Obstetrics and Gynecology

## 2019-01-07 ENCOUNTER — Ambulatory Visit (HOSPITAL_COMMUNITY): Payer: Medicaid Other | Admitting: *Deleted

## 2019-01-07 ENCOUNTER — Other Ambulatory Visit: Payer: Self-pay | Admitting: Obstetrics and Gynecology

## 2019-01-07 ENCOUNTER — Other Ambulatory Visit: Payer: Self-pay

## 2019-01-07 ENCOUNTER — Encounter (HOSPITAL_COMMUNITY): Payer: Self-pay

## 2019-01-07 VITALS — BP 147/73 | HR 117 | Temp 98.7°F

## 2019-01-07 VITALS — BP 136/87 | HR 101 | Temp 97.3°F | Wt 218.0 lb

## 2019-01-07 DIAGNOSIS — O10013 Pre-existing essential hypertension complicating pregnancy, third trimester: Secondary | ICD-10-CM | POA: Diagnosis not present

## 2019-01-07 DIAGNOSIS — O10919 Unspecified pre-existing hypertension complicating pregnancy, unspecified trimester: Secondary | ICD-10-CM | POA: Diagnosis not present

## 2019-01-07 DIAGNOSIS — O10913 Unspecified pre-existing hypertension complicating pregnancy, third trimester: Secondary | ICD-10-CM

## 2019-01-07 DIAGNOSIS — O0993 Supervision of high risk pregnancy, unspecified, third trimester: Secondary | ICD-10-CM

## 2019-01-07 DIAGNOSIS — Z362 Encounter for other antenatal screening follow-up: Secondary | ICD-10-CM | POA: Diagnosis not present

## 2019-01-07 DIAGNOSIS — Z3A39 39 weeks gestation of pregnancy: Secondary | ICD-10-CM

## 2019-01-07 DIAGNOSIS — O0990 Supervision of high risk pregnancy, unspecified, unspecified trimester: Secondary | ICD-10-CM

## 2019-01-07 DIAGNOSIS — O99213 Obesity complicating pregnancy, third trimester: Secondary | ICD-10-CM | POA: Diagnosis not present

## 2019-01-07 NOTE — Progress Notes (Signed)
   PRENATAL VISIT NOTE  Subjective:  Madison Allen is a 28 y.o. G1P0 at [redacted]w[redacted]d being seen today for ongoing prenatal care.  She is currently monitored for the following issues for this high-risk pregnancy and has Supervision of high risk pregnancy, antepartum and Chronic hypertension affecting pregnancy on their problem list.  Patient reports no complaints.  Contractions: Not present. Vag. Bleeding: None.  Movement: Present. Denies leaking of fluid.   The following portions of the patient's history were reviewed and updated as appropriate: allergies, current medications, past family history, past medical history, past social history, past surgical history and problem list.   Objective:   Vitals:   01/07/19 1415  BP: 136/87  Pulse: (!) 101  Temp: (!) 97.3 F (36.3 C)  Weight: 218 lb (98.9 kg)    Fetal Status: Fetal Heart Rate (bpm): 150 Fundal Height: 40 cm Movement: Present  Presentation: Vertex  General:  Alert, oriented and cooperative. Patient is in no acute distress.  Skin: Skin is warm and dry. No rash noted.   Cardiovascular: Normal heart rate noted  Respiratory: Normal respiratory effort, no problems with respiration noted  Abdomen: Soft, gravid, appropriate for gestational age.  Pain/Pressure: Absent     Pelvic: Cervical exam performed Dilation: 1 Effacement (%): 60 Station: Ballotable  Extremities: Normal range of motion.  Edema: None  Mental Status: Normal mood and affect. Normal behavior. Normal judgment and thought content.   Assessment and Plan:  Pregnancy: G1P0 at [redacted]w[redacted]d Supervision of high risk pregnancy, antepartum  Chronic hypertension affecting pregnancy - IOL @ 40 wks  scheduled for  01/13/2019 @ 0000 - FB insertion in MAU on 01/12/2019 at noon   Term labor symptoms and general obstetric precautions including but not limited to vaginal bleeding, contractions, leaking of fluid and fetal movement were reviewed in detail with the patient. Please refer to After  Visit Summary for other counseling recommendations.   Return in about 6 weeks (around 02/18/2019) for Postpartum Visit.  Future Appointments  Date Time Provider [place]  01/13/2019 12:00 AM [facility] [place] None    Laury Deep, CNM

## 2019-01-07 NOTE — Patient Instructions (Signed)
Labor Induction  Labor induction is when steps are taken to cause a pregnant woman to begin the labor process. Most women go into labor on their own between 37 weeks and 42 weeks of pregnancy. When this does not happen or when there is a medical need for labor to begin, steps may be taken to induce labor. Labor induction causes a pregnant woman's uterus to contract. It also causes the cervix to soften (ripen), open (dilate), and thin out (efface). Usually, labor is not induced before 39 weeks of pregnancy unless there is a medical reason to do so. Your health care provider will determine if labor induction is needed. Before inducing labor, your health care provider will consider a number of factors, including:  Your medical condition and your baby's.  How many weeks along you are in your pregnancy.  How mature your baby's lungs are.  The condition of your cervix.  The position of your baby.  The size of your birth canal. What are some reasons for labor induction? Labor may be induced if:  Your health or your baby's health is at risk.  Your pregnancy is overdue by 1 week or more.  Your water breaks but labor does not start on its own.  There is a low amount of amniotic fluid around your baby. You may also choose (elect) to have labor induced at a certain time. Generally, elective labor induction is done no earlier than 39 weeks of pregnancy. What methods are used for labor induction? Methods used for labor induction include:  Prostaglandin medicine. This medicine starts contractions and causes the cervix to dilate and ripen. It can be taken by mouth (orally) or by being inserted into the vagina (suppository).  Inserting a small, thin tube (catheter) with a balloon into the vagina and then expanding the balloon with water to dilate the cervix.  Stripping the membranes. In this method, your health care provider gently separates amniotic sac tissue from the cervix. This causes the  cervix to stretch, which in turn causes the release of a hormone called progesterone. The hormone causes the uterus to contract. This procedure is often done during an office visit, after which you will be sent home to wait for contractions to begin.  Breaking the water. In this method, your health care provider uses a small instrument to make a small hole in the amniotic sac. This eventually causes the amniotic sac to break. Contractions should begin after a few hours.  Medicine to trigger or strengthen contractions. This medicine is given through an IV that is inserted into a vein in your arm. Except for membrane stripping, which can be done in a clinic, labor induction is done in the hospital so that you and your baby can be carefully monitored. How long does it take for labor to be induced? The length of time it takes to induce labor depends on how ready your body is for labor. Some inductions can take up to 2-3 days, while others may take less than a day. Induction may take longer if:  You are induced early in your pregnancy.  It is your first pregnancy.  Your cervix is not ready. What are some risks associated with labor induction? Some risks associated with labor induction include:  Changes in fetal heart rate, such as being too high, too low, or irregular (erratic).  Failed induction.  Infection in the mother or the baby.  Increased risk of having a cesarean delivery.  Fetal death.  Breaking off (abruption)   high, too low, or irregular (erratic).  · Failed induction.  · Infection in the mother or the baby.  · Increased risk of having a cesarean delivery.  · Fetal death.  · Breaking off (abruption) of the placenta from the uterus (rare).  · Rupture of the uterus (very rare).  When induction is needed for medical reasons, the benefits of induction generally outweigh the risks.  What are some reasons for not inducing labor?  Labor induction should not be done if:  · Your baby does not tolerate contractions.  · You have had previous surgeries on your uterus, such as a myomectomy, removal of fibroids, or a vertical scar from a previous cesarean delivery.  · Your placenta lies  very low in your uterus and blocks the opening of the cervix (placenta previa).  · Your baby is not in a head-down position.  · The umbilical cord drops down into the birth canal in front of the baby.  · There are unusual circumstances, such as the baby being very early (premature).  · You have had more than 2 previous cesarean deliveries.  Summary  · Labor induction is when steps are taken to cause a pregnant woman to begin the labor process.  · Labor induction causes a pregnant woman's uterus to contract. It also causes the cervix to ripen, dilate, and efface.  · Labor is not induced before 39 weeks of pregnancy unless there is a medical reason to do so.  · When induction is needed for medical reasons, the benefits of induction generally outweigh the risks.  This information is not intended to replace advice given to you by your health care provider. Make sure you discuss any questions you have with your health care provider.  Document Released: 01/08/2007 Document Revised: 10/02/2016 Document Reviewed: 10/02/2016  Elsevier Interactive Patient Education © 2019 [employer]        Balloon Catheter Placement for Cervical Ripening  Balloon catheter placement for cervical ripening is a procedure to help your cervix start to soften (ripen) and open (dilate). It is done to prepare your body for labor induction. During this procedure, a thin tube (catheter) is placed through your cervix. A tiny balloon attached to the catheter is inflated with water. Pressure from the balloon is what helps your cervix start to open. Cervical ripening with a balloon catheter can make labor induction shorter and easier.  You may have this procedure if:  · Your cervix is not ready for labor.  · Your health care provider has planned labor induction.  · You are not having twins or multiples.  · Your baby is in the head-down position.  · You do not have any other pregnancy complications that require you to be monitored in the hospital after  balloon catheter placement.  If your health care provider has recommended labor induction to stimulate a vaginal birth, this procedure may be started the day before induction. You will go home with the balloon in place and return to start induction in 12-24 hours. You may have this procedure and stay in the hospital so that your progress can be monitored as well.  Tell a health care provider about:  · Any allergies you have.  · All medicines you are taking, including vitamins, herbs, eye drops, creams, and over-the-counter medicines.  · Any blood disorders you have.  · Any surgeries you have had.  · Any medical conditions you have.  What are the risks?  Generally, this is a safe procedure.   However, problems may occur, including:  · Infection.  · Bleeding.  · Cramping or pain.  · Difficulty passing urine.  · The baby moving from the head-down position to a position with the feet or buttocks down (breech position).  What happens before the procedure?  · Your health care provider may check your baby's heartbeat (fetal monitoring) before the procedure.  · You may be asked to empty your bladder.  What happens during the procedure?    · You will be positioned on the exam table as if you were having a pelvic exam or Pap test.  · Your health care provider may insert a medical instrument into your vagina (speculum) to see your cervix.  · Your cervix may be cleaned with a germ-killing solution.  · The catheter will be inserted through the opening of your cervix.  · A balloon on the end of the catheter will be inflated with sterile water. Some catheters have two balloons, one on each side of the cervix.  · Depending on the type of balloon catheter, the end of the catheter may be left free outside your cervix or taped to your leg.  The procedure may vary among health care providers and hospitals.  What can I expect after the procedure?  · After the procedure, it is common to have:  ? A feeling of pressure inside your  vagina.  ? Light vaginal bleeding (spotting).  · You may have fetal monitoring before going home.  · You may be sent home with the catheter in place and asked to return to start your induction in about 12-24 hours.  Follow these instructions at home:  · Take over-the-counter and prescription medicines only as told by your health care provider.  · Return to your normal activities at home as told by your health care provider. Ask your health care provider what activities are safe for you. Do not leave home until you return for labor induction.  · You may shower at home. Do not take baths, swim, or use a hot tub unless your health care provider approves.  · As your cervix opens, your catheter and balloon may fall out before you return for labor induction. Ask your health care provider what you should do if this happens.  · Keep all follow-up visits as told by your health care provider. This is important. You will need to return to start induction as told by your health care provider.  Contact your health care provider if:  · You have chills or a fever.  · You have constant pain or cramps (not contractions).  · You have trouble passing urine.  · Your water breaks.  · You have vaginal bleeding that is heavier than spotting.  · You have contractions that start to last longer and come closer together (about every 5 minutes).  · The balloon catheter falls out before you return to start your induction.  Summary  · Cervical ripening with placement of a balloon catheter is an outpatient procedure to prepare you for labor induction.  · Cervical ripening with a balloon catheter helps your cervix start to open for birth.  · You will be positioned on the exam table. The catheter will be inserted through the opening of your cervix. A balloon on the end of the catheter will be inflated with water.  · Pressure from the balloon will cause ripening of your cervix. You will go home with the balloon in place and return to start induction

## 2019-01-08 ENCOUNTER — Other Ambulatory Visit: Payer: Self-pay | Admitting: Advanced Practice Midwife

## 2019-01-08 ENCOUNTER — Telehealth (HOSPITAL_COMMUNITY): Payer: Self-pay | Admitting: *Deleted

## 2019-01-08 NOTE — Telephone Encounter (Signed)
Preadmission screen  

## 2019-01-11 ENCOUNTER — Other Ambulatory Visit: Payer: Self-pay

## 2019-01-11 ENCOUNTER — Other Ambulatory Visit (HOSPITAL_COMMUNITY)
Admission: RE | Admit: 2019-01-11 | Discharge: 2019-01-11 | Disposition: A | Discharge status: Home / Self Care | Payer: Medicaid Other | Source: Ambulatory Visit | Attending: Obstetrics and Gynecology | Admitting: Obstetrics and Gynecology

## 2019-01-11 ENCOUNTER — Encounter: Payer: Self-pay | Admitting: General Practice

## 2019-01-11 DIAGNOSIS — Z1159 Encounter for screening for other viral diseases: Secondary | ICD-10-CM | POA: Insufficient documentation

## 2019-01-11 NOTE — [facility] Note (Signed)
Pt had COVID testing done, no symptoms. Specimen sent to lab.

## 2019-01-12 ENCOUNTER — Inpatient Hospital Stay (EMERGENCY_DEPARTMENT_HOSPITAL)
Admission: AD | Admit: 2019-01-12 | Discharge: 2019-01-12 | Disposition: A | Discharge status: Home / Self Care | Payer: Medicaid Other | Source: Home / Self Care | Attending: Obstetrics and Gynecology | Admitting: Obstetrics and Gynecology

## 2019-01-12 ENCOUNTER — Other Ambulatory Visit (HOSPITAL_COMMUNITY): Payer: Self-pay | Admitting: *Deleted

## 2019-01-12 ENCOUNTER — Encounter (HOSPITAL_COMMUNITY): Payer: Self-pay

## 2019-01-12 ENCOUNTER — Encounter (HOSPITAL_COMMUNITY): Payer: Self-pay | Admitting: *Deleted

## 2019-01-12 ENCOUNTER — Other Ambulatory Visit: Payer: Self-pay

## 2019-01-12 DIAGNOSIS — O10919 Unspecified pre-existing hypertension complicating pregnancy, unspecified trimester: Secondary | ICD-10-CM | POA: Diagnosis not present

## 2019-01-12 DIAGNOSIS — Z3689 Encounter for other specified antenatal screening: Secondary | ICD-10-CM

## 2019-01-12 DIAGNOSIS — O0990 Supervision of high risk pregnancy, unspecified, unspecified trimester: Secondary | ICD-10-CM

## 2019-01-12 DIAGNOSIS — O26893 Other specified pregnancy related conditions, third trimester: Secondary | ICD-10-CM | POA: Insufficient documentation

## 2019-01-12 DIAGNOSIS — Z3A40 40 weeks gestation of pregnancy: Secondary | ICD-10-CM

## 2019-01-12 LAB — NOVEL CORONAVIRUS, NAA (HOSP ORDER, SEND-OUT TO REF LAB; TAT 18-24 HRS): SARS-CoV-2, NAA: NOT DETECTED

## 2019-01-12 NOTE — Discharge Instructions (Signed)
Labor Induction    Labor induction is when steps are taken to cause a pregnant woman to begin the labor process. Most women go into labor on their own between 37 weeks and 42 weeks of pregnancy. When this does not happen or when there is a medical need for labor to begin, steps may be taken to induce labor. Labor induction causes a pregnant woman's uterus to contract. It also causes the cervix to soften (ripen), open (dilate), and thin out (efface). Usually, labor is not induced before 39 weeks of pregnancy unless there is a medical reason to do so. Your health care provider will determine if labor induction is needed.  Before inducing labor, your health care provider will consider a number of factors, including:  · Your medical condition and your baby's.  · How many weeks along you are in your pregnancy.  · How mature your baby's lungs are.  · The condition of your cervix.  · The position of your baby.  · The size of your birth canal.  What are some reasons for labor induction?  Labor may be induced if:  · Your health or your baby's health is at risk.  · Your pregnancy is overdue by 1 week or more.  · Your water breaks but labor does not start on its own.  · There is a low amount of amniotic fluid around your baby.  You may also choose (elect) to have labor induced at a certain time. Generally, elective labor induction is done no earlier than 39 weeks of pregnancy.  What methods are used for labor induction?  Methods used for labor induction include:  · Prostaglandin medicine. This medicine starts contractions and causes the cervix to dilate and ripen. It can be taken by mouth (orally) or by being inserted into the vagina (suppository).  · Inserting a small, thin tube (catheter) with a balloon into the vagina and then expanding the balloon with water to dilate the cervix.  · Stripping the membranes. In this method, your health care provider gently separates amniotic sac tissue from the cervix. This causes the  cervix to stretch, which in turn causes the release of a hormone called progesterone. The hormone causes the uterus to contract. This procedure is often done during an office visit, after which you will be sent home to wait for contractions to begin.  · Breaking the water. In this method, your health care provider uses a small instrument to make a small hole in the amniotic sac. This eventually causes the amniotic sac to break. Contractions should begin after a few hours.  · Medicine to trigger or strengthen contractions. This medicine is given through an IV that is inserted into a vein in your arm.  Except for membrane stripping, which can be done in a clinic, labor induction is done in the hospital so that you and your baby can be carefully monitored.  How long does it take for labor to be induced?  The length of time it takes to induce labor depends on how ready your body is for labor. Some inductions can take up to 2-3 days, while others may take less than a day. Induction may take longer if:  · You are induced early in your pregnancy.  · It is your first pregnancy.  · Your cervix is not ready.  What are some risks associated with labor induction?  Some risks associated with labor induction include:  · Changes in fetal heart rate, such as being too   high, too low, or irregular (erratic).  · Failed induction.  · Infection in the mother or the baby.  · Increased risk of having a cesarean delivery.  · Fetal death.  · Breaking off (abruption) of the placenta from the uterus (rare).  · Rupture of the uterus (very rare).  When induction is needed for medical reasons, the benefits of induction generally outweigh the risks.  What are some reasons for not inducing labor?  Labor induction should not be done if:  · Your baby does not tolerate contractions.  · You have had previous surgeries on your uterus, such as a myomectomy, removal of fibroids, or a vertical scar from a previous cesarean delivery.  · Your placenta lies  very low in your uterus and blocks the opening of the cervix (placenta previa).  · Your baby is not in a head-down position.  · The umbilical cord drops down into the birth canal in front of the baby.  · There are unusual circumstances, such as the baby being very early (premature).  · You have had more than 2 previous cesarean deliveries.  Summary  · Labor induction is when steps are taken to cause a pregnant woman to begin the labor process.  · Labor induction causes a pregnant woman's uterus to contract. It also causes the cervix to ripen, dilate, and efface.  · Labor is not induced before 39 weeks of pregnancy unless there is a medical reason to do so.  · When induction is needed for medical reasons, the benefits of induction generally outweigh the risks.  This information is not intended to replace advice given to you by your health care provider. Make sure you discuss any questions you have with your health care provider.  Document Released: 01/08/2007 Document Revised: 10/02/2016 Document Reviewed: 10/02/2016  Elsevier Interactive Patient Education © 2019 [employer]

## 2019-01-12 NOTE — MAU Note (Signed)
Pt for induction at midnight, here for foley bulb placement. Denies pains, bleeding or leaking.  No problems with preg.

## 2019-01-12 NOTE — MAU Provider Note (Signed)
History     CSN: [id number]  Arrival date & time 01/12/19  1201   First Provider Initiated Contact with Patient 01/12/19 1244      Chief Complaint  Patient presents with  . foley bulb placement, for cervical ripening    HPI Madison Allen is a 28 y.o. G1P0 at [redacted]w[redacted]d who presents to MAU for placement of foley balloon for cervical ripening. She denies vaginal bleeding, leaking of fluid, decreased fetal movement, fever, falls, or recent illness.    Past Medical History:  Diagnosis Date  . Asthma   . Hypertension     Past Surgical History:  Procedure Laterality Date  . NO PAST SURGERIES      Family History  Problem Relation Age of Onset  . Diabetes Mother     Social History   Tobacco Use  . Smoking status: Never Smoker  . Smokeless tobacco: Never Used  Substance Use Topics  . Alcohol use: Not Currently  . Drug use: Never    OB History    Gravida  1   Para      Term      Preterm      AB      Living  0     SAB      TAB      Ectopic      Multiple      Live Births             Allergies  Patient has no known allergies.  BP 133/74   Pulse 88   Temp 98.6 F (37 C) (Oral)   Resp 16   Ht 5\' 8"  (1.727 m)   Wt 99.6 kg   LMP 04/07/2018 (Exact Date)   SpO2 100%   BMI 33.39 kg/m   Physical Exam Vitals signs reviewed.  Constitutional:      Appearance: She is normal weight.  Neck:     Musculoskeletal: Normal range of motion.  Cardiovascular:     Pulses: Normal pulses.  Pulmonary:     Effort: Pulmonary effort is normal.  Abdominal:     Comments: Gravid  Genitourinary:    General: Normal vulva.  Musculoskeletal: Normal range of motion.  Skin:    Capillary Refill: Capillary refill takes less than 2 seconds.  Neurological:     Mental Status: She is alert.   Fetal Status:Category I  General:  Alert, oriented and cooperative. Patient is in no acute distress.  Skin: Skin is warm and dry. No rash noted.   Cardiovascular: Normal heart  rate noted  Respiratory: Normal respiratory effort, no problems with respiration noted  Abdomen: Soft, gravid, appropriate for gestational age.        Pelvic: Cervical exam performed  1.5/50/-3, fetal sutures palpable  Extremities: Normal range of motion.     Mental Status:  Normal mood and affect. Normal behavior. Normal judgment and thought content.  Procedure: Patient informed of R/B/A of procedure. NST was performed and was reactive prior to procedure.  NST:  EFM: Baseline: 135 bpm Toco: none Procedure done to begin ripening of the cervix prior to admission for induction of labor. Appropriate time out taken. The patient was placed in the lithotomy position and a cervical exam was performed and a finger was used to guide the 35F foley balloon through the internal os of the cervix. Foley Balloon filled with 60cc of normal saline. Plug inserted into end of the foley. Foley placed on tension and taped to medial thigh.   Post  Insertion NST: Unchanged, Category I, baseline 135, moderate variability, positive accels, no decels  Assessment and Plan:  Pregnancy: G1P0 at [redacted]w[redacted]d  S/p Outpatient placement of foley balloon catheter for cervical ripening. Induction of labor scheduled for tonight at midnight. Reassuring FHR tracing with no concerns at present. Warning signs given to patient to include return to MAU for heavy vaginal bleeding, Rupture of membranes, painful uterine contractions q 5 mins or less, severe abdominal discomfort, decreased fetal movement.  Patient to return for IOL tonight at midnight  Darlina Rumpf, [place] 01/12/2019 1:59 PM

## 2019-01-13 ENCOUNTER — Encounter (HOSPITAL_COMMUNITY): Payer: Self-pay | Admitting: *Deleted

## 2019-01-13 ENCOUNTER — Other Ambulatory Visit: Payer: Self-pay

## 2019-01-13 ENCOUNTER — Inpatient Hospital Stay (HOSPITAL_COMMUNITY)
Admission: AD | Admit: 2019-01-13 | Discharge: 2019-01-15 | DRG: 807 | Disposition: A | Discharge status: Home / Self Care | Payer: Medicaid Other | Attending: Obstetrics and Gynecology | Admitting: Obstetrics and Gynecology

## 2019-01-13 ENCOUNTER — Inpatient Hospital Stay (HOSPITAL_COMMUNITY): Payer: Medicaid Other

## 2019-01-13 DIAGNOSIS — O10919 Unspecified pre-existing hypertension complicating pregnancy, unspecified trimester: Secondary | ICD-10-CM | POA: Diagnosis present

## 2019-01-13 DIAGNOSIS — O480 Post-term pregnancy: Secondary | ICD-10-CM | POA: Diagnosis not present

## 2019-01-13 DIAGNOSIS — O1002 Pre-existing essential hypertension complicating childbirth: Principal | ICD-10-CM | POA: Diagnosis present

## 2019-01-13 DIAGNOSIS — Z3A40 40 weeks gestation of pregnancy: Secondary | ICD-10-CM

## 2019-01-13 DIAGNOSIS — O0990 Supervision of high risk pregnancy, unspecified, unspecified trimester: Secondary | ICD-10-CM

## 2019-01-13 LAB — TYPE AND SCREEN
ABO/RH(D): O POS
Antibody Screen: NEGATIVE

## 2019-01-13 LAB — CBC
HCT: 37.7 % (ref 36.0–46.0)
Hemoglobin: 13 g/dL (ref 12.0–15.0)
MCH: 32.5 pg (ref 26.0–34.0)
MCHC: 34.5 g/dL (ref 30.0–36.0)
MCV: 94.3 fL (ref 80.0–100.0)
Platelets: 199 10*3/uL (ref 150–400)
RBC: 4 MIL/uL (ref 3.87–5.11)
RDW: 13.1 % (ref 11.5–15.5)
WBC: 7.5 10*3/uL (ref 4.0–10.5)
nRBC: 0 % (ref 0.0–0.2)

## 2019-01-13 LAB — ABO/RH: ABO/RH(D): O POS

## 2019-01-13 LAB — COMPREHENSIVE METABOLIC PANEL
ALT: 21 U/L (ref 0–44)
AST: 22 U/L (ref 15–41)
Albumin: 3 g/dL — ABNORMAL LOW (ref 3.5–5.0)
Alkaline Phosphatase: 141 U/L — ABNORMAL HIGH (ref 38–126)
Anion gap: 10 (ref 5–15)
BUN: 8 mg/dL (ref 6–20)
CO2: 21 mmol/L — ABNORMAL LOW (ref 22–32)
Calcium: 9.1 mg/dL (ref 8.9–10.3)
Chloride: 106 mmol/L (ref 98–111)
Creatinine, Ser: 0.67 mg/dL (ref 0.44–1.00)
GFR calc Af Amer: >60 mL/min (ref >60)
GFR calc non Af Amer: >60 mL/min (ref >60)
Glucose, Bld: 154 mg/dL — ABNORMAL HIGH (ref 70–99)
Potassium: 3.9 mmol/L (ref 3.5–5.1)
Sodium: 137 mmol/L (ref 135–145)
Total Bilirubin: 0.6 mg/dL (ref 0.3–1.2)
Total Protein: 6.7 g/dL (ref 6.5–8.1)

## 2019-01-13 LAB — PROTEIN / CREATININE RATIO, URINE
Creatinine, Urine: 202.73 mg/dL
Protein Creatinine Ratio: 0.36 mg/mg{Cre} — ABNORMAL HIGH (ref 0.00–0.15)
Total Protein, Urine: 72 mg/dL

## 2019-01-13 LAB — RPR: RPR Ser Ql: NONREACTIVE

## 2019-01-13 MED ORDER — ZOLPIDEM TARTRATE 5 MG PO TABS: 5.0000 mg | ORAL_TABLET | Freq: Every evening | ORAL | Status: DC | PRN

## 2019-01-13 MED ORDER — ACETAMINOPHEN 325 MG PO TABS
650.0000 mg | ORAL_TABLET | ORAL | Status: DC | PRN
  Filled 2019-01-13: qty 2

## 2019-01-13 MED ORDER — OXYTOCIN 40 UNITS IN NORMAL SALINE INFUSION - SIMPLE MED: 2.5000 [IU]/h | INTRAVENOUS | Status: DC

## 2019-01-13 MED ORDER — PRENATAL MULTIVITAMIN CH
1.0000 | ORAL_TABLET | Freq: Every day | ORAL | Status: DC
  Administered 2019-01-14 – 2019-01-15 (×2): 1 via ORAL
  Filled 2019-01-13 (×3): qty 1

## 2019-01-13 MED ORDER — OXYTOCIN 40 UNITS IN NORMAL SALINE INFUSION - SIMPLE MED
1.0000 m[IU]/min | INTRAVENOUS | Status: DC
  Administered 2019-01-13: 01:00:00 2 m[IU]/min via INTRAVENOUS
  Filled 2019-01-13: qty 1000

## 2019-01-13 MED ORDER — LACTATED RINGERS IV SOLN
INTRAVENOUS | Status: DC
  Administered 2019-01-13: 01:00:00 via INTRAVENOUS

## 2019-01-13 MED ORDER — DIBUCAINE (PERIANAL) 1 % EX OINT: 1.0000 "application " | TOPICAL_OINTMENT | CUTANEOUS | Status: DC | PRN

## 2019-01-13 MED ORDER — LIDOCAINE HCL (PF) 1 % IJ SOLN
30.0000 mL | INTRAMUSCULAR | Status: AC | PRN
  Administered 2019-01-13: 30 mL via SUBCUTANEOUS
  Filled 2019-01-13: qty 30

## 2019-01-13 MED ORDER — LACTATED RINGERS IV SOLN: 500.0000 mL | INTRAVENOUS | Status: DC | PRN

## 2019-01-13 MED ORDER — WITCH HAZEL-GLYCERIN EX PADS: 1.0000 "application " | MEDICATED_PAD | CUTANEOUS | Status: DC | PRN

## 2019-01-13 MED ORDER — ONDANSETRON HCL 4 MG/2ML IJ SOLN: 4.0000 mg | INTRAMUSCULAR | Status: DC | PRN

## 2019-01-13 MED ORDER — FENTANYL CITRATE (PF) 100 MCG/2ML IJ SOLN: 100.0000 ug | INTRAMUSCULAR | Status: DC | PRN

## 2019-01-13 MED ORDER — DIPHENHYDRAMINE HCL 25 MG PO CAPS: 25.0000 mg | ORAL_CAPSULE | Freq: Four times a day (QID) | ORAL | Status: DC | PRN

## 2019-01-13 MED ORDER — ONDANSETRON HCL 4 MG PO TABS: 4.0000 mg | ORAL_TABLET | ORAL | Status: DC | PRN

## 2019-01-13 MED ORDER — SIMETHICONE 80 MG PO CHEW: 80.0000 mg | CHEWABLE_TABLET | ORAL | Status: DC | PRN

## 2019-01-13 MED ORDER — BENZOCAINE-MENTHOL 20-0.5 % EX AERO
1.0000 "application " | INHALATION_SPRAY | CUTANEOUS | Status: DC | PRN
  Administered 2019-01-13: 1 via TOPICAL
  Filled 2019-01-13: qty 56

## 2019-01-13 MED ORDER — OXYTOCIN 10 UNIT/ML IJ SOLN: 10.0000 [IU] | Freq: Once | INTRAMUSCULAR | Status: DC | PRN

## 2019-01-13 MED ORDER — OXYTOCIN BOLUS FROM INFUSION
500.0000 mL | Freq: Once | INTRAVENOUS | Status: AC
  Administered 2019-01-13: 07:00:00 500 mL via INTRAVENOUS

## 2019-01-13 MED ORDER — TERBUTALINE SULFATE 1 MG/ML IJ SOLN: 0.2500 mg | Freq: Once | INTRAMUSCULAR | Status: DC | PRN

## 2019-01-13 MED ORDER — OXYCODONE-ACETAMINOPHEN 5-325 MG PO TABS: 1.0000 | ORAL_TABLET | ORAL | Status: DC | PRN

## 2019-01-13 MED ORDER — TETANUS-DIPHTH-ACELL PERTUSSIS 5-2.5-18.5 LF-MCG/0.5 IM SUSP: 0.5000 mL | Freq: Once | INTRAMUSCULAR | Status: DC

## 2019-01-13 MED ORDER — OXYCODONE-ACETAMINOPHEN 5-325 MG PO TABS: 2.0000 | ORAL_TABLET | ORAL | Status: DC | PRN

## 2019-01-13 MED ORDER — IBUPROFEN 600 MG PO TABS
600.0000 mg | ORAL_TABLET | Freq: Four times a day (QID) | ORAL | Status: DC
  Administered 2019-01-13 – 2019-01-15 (×9): 600 mg via ORAL
  Filled 2019-01-13 (×9): qty 1

## 2019-01-13 MED ORDER — SENNOSIDES-DOCUSATE SODIUM 8.6-50 MG PO TABS
2.0000 | ORAL_TABLET | ORAL | Status: DC
  Administered 2019-01-13 – 2019-01-14 (×2): 2 via ORAL
  Filled 2019-01-13 (×2): qty 2

## 2019-01-13 MED ORDER — COCONUT OIL OIL: 1.0000 "application " | TOPICAL_OIL | Status: DC | PRN

## 2019-01-13 MED ORDER — SOD CITRATE-CITRIC ACID 500-334 MG/5ML PO SOLN: 30.0000 mL | ORAL | Status: DC | PRN

## 2019-01-13 MED ORDER — ONDANSETRON HCL 4 MG/2ML IJ SOLN
4.0000 mg | Freq: Four times a day (QID) | INTRAMUSCULAR | Status: DC | PRN
  Administered 2019-01-13: 4 mg via INTRAVENOUS
  Filled 2019-01-13: qty 2

## 2019-01-13 MED ORDER — ACETAMINOPHEN 325 MG PO TABS: 650.0000 mg | ORAL_TABLET | ORAL | Status: DC | PRN

## 2019-01-13 NOTE — Discharge Summary (Signed)
OB Discharge Summary     Patient Name: Madison Allen DOB: [birth year] MRN: [id number]  Date of admission: 01/13/2019 Delivering MD: Seabron Spates   Date of discharge: 01/15/2019  Admitting diagnosis: pregnancy Intrauterine pregnancy: [redacted]w[redacted]d     Secondary diagnosis:  Active Problems:   Chronic hypertension affecting pregnancy  Discharge diagnosis: Term Pregnancy Delivered and second degree perineal laceration, periurethral laceration                                                                                                Postpartum procedures:none Augmentation: Pitocin Complications: None  Hospital course:  Induction of Labor With Vaginal Delivery   28 y.o. yo G1P0 at [redacted]w[redacted]d was admitted to the hospital 01/13/2019 for induction of labor.  Indication for induction: Chronic hypertension.  Patient had an uncomplicated labor course as follows: Membrane Rupture Time/Date: 6:40 AM ,01/13/2019   Intrapartum Procedures: Episiotomy: None [1]                                         Lacerations:  2nd degree [3];Labial [10]  Patient had delivery of a Viable infant.  Information for the patient's newborn:  Coda, Filler [[id number]]  Delivery Method: Vaginal, Spontaneous(Filed from Delivery Summary)   01/13/2019  Details of delivery can be found in separate delivery note.  Patient had a routine postpartum course. She was started on enalapril for elevated blood pressures but denied headaches, blurry vision on day of discharge. BP was in moderate to normal range. Patient is discharged home 01/15/19.  Physical exam  Vitals:   01/14/19 0520 01/14/19 1300 01/14/19 2242 01/15/19 0500  BP: (!) 149/80 134/85 133/75 (!) 147/84  Pulse: 84 80 74 89  Resp: 16 18 18 18   Temp: 98.2 F (36.8 C) 98.2 F (36.8 C)  98.1 F (36.7 C)  TempSrc: Oral Oral  Oral  SpO2: 100% 98%  99%  Weight:      Height:       General: alert, well-appearing, NAD Lochia: appropriate Uterine Fundus:  firm Incision: N/A DVT Evaluation: No significant calf/ankle edema. Labs: Lab Results  Component Value Date   WBC 7.5 01/13/2019   HGB 13.0 01/13/2019   HCT 37.7 01/13/2019   MCV 94.3 01/13/2019   PLT 199 01/13/2019   CMP Latest Ref Rng & Units 01/13/2019  Glucose 70 - 99 mg/dL 154(H)  BUN 6 - 20 mg/dL 8  Creatinine 0.44 - 1.00 mg/dL 0.67  Sodium 135 - 145 mmol/L 137  Potassium 3.5 - 5.1 mmol/L 3.9  Chloride 98 - 111 mmol/L 106  CO2 22 - 32 mmol/L 21(L)  Calcium 8.9 - 10.3 mg/dL 9.1  Total Protein 6.5 - 8.1 g/dL 6.7  Total Bilirubin 0.3 - 1.2 mg/dL 0.6  Alkaline Phos 38 - 126 U/L 141(H)  AST 15 - 41 U/L 22  ALT 0 - 44 U/L 21    Discharge instruction: per After Visit Summary and "Baby and Me Booklet".  After visit meds:  Allergies as of 01/15/2019  No Known Allergies     Medication List    STOP taking these medications   aspirin EC 81 MG tablet     TAKE these medications   acetaminophen 325 MG tablet Commonly known as:  Tylenol Take 2 tablets (650 mg total) by mouth every 4 (four) hours as needed (for pain scale < 4).   enalapril 5 MG tablet Commonly known as:  VASOTEC Take 1 tablet (5 mg total) by mouth daily.   ibuprofen 800 MG tablet Commonly known as:  ADVIL Take 1 tablet (800 mg total) by mouth 3 (three) times daily.   Prenatal Complete 14-0.4 MG Tabs Take 1 tablet by mouth daily.   senna-docusate 8.6-50 MG tablet Commonly known as:  Senokot-S Take 2 tablets by mouth at bedtime as needed for mild constipation.       Diet: routine diet Activity: Advance as tolerated. Pelvic rest for 6 weeks.   Outpatient follow up:1 week BP check Follow up Appt: Future Appointments  Date Time Provider [place]  01/22/2019 10:00 AM [place] None  02/24/2019  1:10 PM Laury Deep, CNM [facility] None   Postpartum contraception: Progesterone only pills  Newborn Data: Live born female  Birth Weight:   APGAR: 67, 9  Newborn  Delivery   Birth date/time:  01/13/2019 07:20:00 Delivery type:  Vaginal, Spontaneous    Baby Feeding: Breast Disposition:either home with mother or rooming in pending peds for dispo  01/15/2019 Glenice Bow, DO

## 2019-01-13 NOTE — Lactation Note (Signed)
This note was copied from a baby's chart. Lactation Consultation Note  Patient Name: Madison Allen [id number] Date: 01/13/2019 Reason for consult: Initial assessment;Primapara;1st time breastfeeding;Term  2006 - 2025 - I visited Madison Allen to offer assistance with breast feeding. She states that baby latched after delivery and has been breast feeding some since that time. She states that she's learning and could use assistance.  I reviewed HE with mom and noted colostrum. Mom repeats back well.  Baby was not cueing, but we practiced positioning while in the room. I helped mom place him in football hold on her left breast. I showed mom how to stimulate baby's filtrum with her nipple, and he soon began to root. He never achieved latch, and he exhibited tongue thrusting behavior.   I offered to allow him to suckle on a gloved finger, and he would not root. I placed baby skin to skin with mom and encouraged her to feed on demand 8-12 times a day, waking to feed as needed. I encouraged her to do lots of STS and lick and learn with baby and to call for assistance as needed.  We reviewed day 1 infant feeding patterns, cues, output expectations, and signs that baby is getting enough to eat. I answered questions about when to give baby a bottle and provided reassurance. Mom and dad to call PRN.  Maternal Data Formula Feeding for Exclusion: No Has patient been taught Hand Expression?: Yes Does the patient have breastfeeding experience prior to this delivery?: No  Feeding Feeding Type: Breast Fed  LATCH Score Latch: Too sleepy or reluctant, no latch achieved, no sucking elicited.  Audible Swallowing: None  Type of Nipple: Everted at rest and after stimulation  Comfort (Breast/Nipple): Soft / non-tender  Hold (Positioning): Assistance needed to correctly position infant at breast and maintain latch.  LATCH Score: 5  Interventions Interventions: Breast feeding basics reviewed;Assisted with  latch;Skin to skin;Hand express;Breast compression;Support pillows  Lactation Tools Discussed/Used     Consult Status Consult Status: Follow-up Date: 01/14/19 Follow-up type: In-patient    Lenore Manner 01/13/2019, 8:30 PM

## 2019-01-13 NOTE — Progress Notes (Signed)
Patient ID: Madison Allen, female   DOB: 1991/06/05, 28 y.o.   MRN: [id number] Doing well  Vitals:   01/13/19 0130 01/13/19 0200 01/13/19 0230 01/13/19 0300  BP: (!) 155/85 (!) 155/99 (!) 143/81 (!) 153/104  Pulse: 89 81 81 84  Resp: 14 16 14 16   Temp:      TempSrc:      Weight:      Height:       Fetal heart rate reactive, Category I UCs not tracing well  WIll continue Pitocin

## 2019-01-13 NOTE — H&P (Signed)
Madison Allen is a 28 y.o. female presenting for Induction of Labor for Chronic Hypertension in pregnancy     Denies headache or visual changes.  Has some cramping since Foley inserted today (12 hrs ago).   Her pregnancy has been followed at the [facility] office.  Has been getting weekly BPPs in MFM, normal.   Patient Active Problem List   Diagnosis Date Noted  . Chronic hypertension affecting pregnancy 07/17/2018  . Supervision of high risk pregnancy, antepartum 07/06/2018    OB History    Gravida  1   Para      Term      Preterm      AB      Living  0     SAB      TAB      Ectopic      Multiple      Live Births             Past Medical History:  Diagnosis Date  . Asthma   . Hypertension    Past Surgical History:  Procedure Laterality Date  . NO PAST SURGERIES     Family History: family history includes Diabetes in her mother. Social History:  reports that she has never smoked. She has never used smokeless tobacco. She reports previous alcohol use. She reports that she does not use drugs.     Maternal Diabetes: No Genetic Screening: Normal Maternal Ultrasounds/Referrals: Normal Fetal Ultrasounds or other Referrals:  None Maternal Substance Abuse:  No Significant Maternal Medications:  None Significant Maternal Lab Results:  Lab values include: Group B Strep negative Other Comments:  None  Review of Systems  Constitutional: Negative for chills and fever.  Eyes: Negative for blurred vision.  Cardiovascular: Negative for leg swelling.  Gastrointestinal: Positive for abdominal pain (cramping). Negative for constipation, diarrhea, nausea and vomiting.  Neurological: Negative for weakness.   Maternal Medical History:  Reason for admission: Nausea. Induction of labor for Chronic Hypertension   Contractions: Frequency: irregular.   Perceived severity is mild.    Fetal activity: Perceived fetal activity is normal.   Last perceived fetal  movement was within the past hour.    Prenatal complications: PIH (chronic).   No bleeding, pre-eclampsia or preterm labor.   Prenatal Complications - Diabetes: none.    Dilation: 4 Effacement (%): 70 Station: -3 Exam by:: Carmelia Roller CNM Blood pressure (!) 141/87, pulse 96, last menstrual period 04/07/2018. Maternal Exam:  Uterine Assessment: Contraction strength is mild.  Contraction frequency is irregular.   Abdomen: Patient reports no abdominal tenderness. Fetal presentation: vertex  Introitus: Normal vulva. Normal vagina.  Ferning test: not done.  Nitrazine test: not done.  Pelvis: adequate for delivery.   Cervix: Cervix evaluated by digital exam.     Fetal Exam Fetal Monitor Review: Mode: ultrasound.   Baseline rate: 140.  Variability: moderate (6-25 bpm).   Pattern: accelerations present and no decelerations.    Fetal State Assessment: Category I - tracings are normal.     Physical Exam  Constitutional: She is oriented to person, place, and time. She appears well-developed and well-nourished. No distress.  HENT:  Head: Normocephalic.  Cardiovascular: Normal rate and regular rhythm.  Respiratory: Effort normal. No respiratory distress.  GI: Soft. She exhibits no distension. There is no abdominal tenderness. There is no rebound and no guarding.  Genitourinary:    Vulva normal.     Genitourinary Comments: Dilation: 4 Effacement (%): 70 Station: -3 Presentation: Vertex Exam by:: Jerilynn Mages  Addysin Porco CNM  Foley Bulb found outside the cervix, so it was removed    Musculoskeletal: Normal range of motion.        General: Edema (Trace peda) present.  Neurological: She is alert and oriented to person, place, and time. She displays normal reflexes. She exhibits normal muscle tone.  Skin: Skin is warm and dry.  Psychiatric: She has a normal mood and affect.    Prenatal labs: ABO, Rh: --/--/PENDING (05/13 0046) Antibody: PENDING (05/13 0046) Rubella: 3.32 (11/04  1010) RPR: Non Reactive (02/07 0833)  HBsAg: Negative (11/04 1010)  HIV: Non Reactive (02/07 0833)  GBS:     Assessment/Plan: [place] intrauterine pregnancy at [redacted]w[redacted]d Chronic Hypertension In Pregnancy  Admit for induction of labor Foley bulb out Will start Pitocin Anticipate SVD   Hansel Feinstein 01/13/2019, 1:14 AM

## 2019-01-14 MED ORDER — ENALAPRIL MALEATE 5 MG PO TABS
5.0000 mg | ORAL_TABLET | Freq: Every day | ORAL | Status: DC
  Administered 2019-01-14 – 2019-01-15 (×2): 5 mg via ORAL
  Filled 2019-01-14 (×2): qty 1

## 2019-01-14 NOTE — Progress Notes (Addendum)
POSTPARTUM PROGRESS NOTE  Post Partum Day 1  Subjective:  Madison Allen is a 28 y.o. G1P1001 s/p SVD at [redacted]w[redacted]d.  No acute events overnight. CHTN with superimposed PrE.  Pt denies problems with ambulating, voiding or po intake.  She denies nausea or vomiting.  Pain is well controlled.  She has had flatus. Lochia Small.   Objective: Blood pressure (!) 149/80, pulse 84, temperature 98.2 F (36.8 C), temperature source Oral, resp. rate 16, height 5\' 8"  (1.727 m), weight 100.5 kg, last menstrual period 04/07/2018, SpO2 100 %, unknown if currently breastfeeding.  Physical Exam:  General: alert, cooperative and no distress Abdomen: soft, nontender,  Uterine Fundus: firm, appropriately tender DVT Evaluation: No calf swelling or tenderness Extremities: negative edema Skin: warm, dry  Recent Labs    01/13/19 0038  HGB 13.0  HCT 37.7    Assessment/Plan: Madison Allen is a 28 y.o. G1P1001 s/p SVD at [redacted]w[redacted]d   PPD# 1 - Doing well Contraception: POP Feeding: Breast Dispo: Plan for discharge tomorrow. Peds indicated baby has elevated Bilirubin.  # CHTN: BP elevated PP. Will start Vasotec 5 mg today.    LOS: 1 day   Noni Saupe I, NP 01/14/2019, 11:25 AM

## 2019-01-14 NOTE — Lactation Note (Signed)
This note was copied from a baby's chart. Lactation Consultation Note  Patient Name: Madison Allen Today's Date: 01/14/2019 Reason for consult: Follow-up assessment;Term;Primapara;1st time breastfeeding  P1 mother whose infant is now 80 hours old.  Baby was beginning to awaken when I arrived.  Offered to assist with latch and mother accepted.  Mother's breasts are large, soft and non tender and nipples are everted and intact.  Mother is familiar with hand expression.  Assisted baby to latch in the football hold on the right breast easily. However, once at the breast he did not want to suck.  Gentle stimulation provided.  Offered to sit him up and awaken him more and mother agreeable.  Showed tips for awakening a sleepy baby.  Mother interested in trying to latch to the left breast.  Assisted baby to latch easily and this time he began rhythmic sucking immediately.  Showed parents the wide gape, flanged lips, good sucking bursts and proper positioning.  Mother very excited to see him feeding.  Demonstrated breast compressions and mother able to return demonstrate.  Observed baby feeding well for 8 minutes prior to my departure.  Encouraged to continue feeding 8-12 times/24 hours or sooner if baby shows cues.  Mother will do hand expression before/after feedings to help increase milk supply.  She will call for latch assistance as needed.  Father present and supportive.   Maternal Data Formula Feeding for Exclusion: No Has patient been taught Hand Expression?: Yes Does the patient have breastfeeding experience prior to this delivery?: No  Feeding Feeding Type: Breast Fed  LATCH Score Latch: Grasps breast easily, tongue down, lips flanged, rhythmical sucking.  Audible Swallowing: A few with stimulation  Type of Nipple: Everted at rest and after stimulation  Comfort (Breast/Nipple): Soft / non-tender  Hold (Positioning): Assistance needed to correctly position infant at breast and  maintain latch.  LATCH Score: 8  Interventions Interventions: Breast feeding basics reviewed;Assisted with latch;Skin to skin;Breast massage;Hand express;Breast compression;Position options;Support pillows;Adjust position  Lactation Tools Discussed/Used     Consult Status Consult Status: Follow-up Date: 01/15/19 Follow-up type: In-patient    Seanmichael Salmons R Macaulay Reicher 01/14/2019, 2:42 PM

## 2019-01-15 MED ORDER — ENALAPRIL MALEATE 5 MG PO TABS: 5.0000 mg | ORAL_TABLET | Freq: Every day | ORAL | 1 refills | Status: DC

## 2019-01-15 MED ORDER — IBUPROFEN 800 MG PO TABS: 800.0000 mg | ORAL_TABLET | Freq: Three times a day (TID) | ORAL | 0 refills | Status: DC

## 2019-01-15 MED ORDER — ACETAMINOPHEN 325 MG PO TABS: 650.0000 mg | ORAL_TABLET | ORAL | Status: DC | PRN

## 2019-01-15 MED ORDER — SENNOSIDES-DOCUSATE SODIUM 8.6-50 MG PO TABS: 2.0000 | ORAL_TABLET | Freq: Every evening | ORAL | Status: DC | PRN

## 2019-01-15 NOTE — Lactation Note (Signed)
This note was copied from a baby's chart. Lactation Consultation Note  Patient Name: Boy Vernette Moise Today's Date: 01/15/2019 Reason for consult: Follow-up assessment Baby is 50 hours old/7% weight loss.  Mom reports that feedings are going well.  She denies questions or concerns.  Discussed milk coming to volume and the prevention and treatment of engorgement.  Mom will have a breast pump for prn use.  Reviewed lactation outpatient information and encouraged to call prn.  Maternal Data    Feeding Feeding Type: Breast Fed  LATCH Score                   Interventions    Lactation Tools Discussed/Used     Consult Status Consult Status: Complete Follow-up type: Call as needed    Ave Filter 01/15/2019, 9:53 AM

## 2019-02-03 NOTE — Telephone Encounter (Signed)
Called patient this morning regarding blood pressures. Pt has not taken blood pressure this morning. Pt denies SOB, dizziness, headache, chest pain, n/v, abdominal pain and visual changes. Patient is taking the Vasotec 5 mg as directed. Pt will send a MyChart message with BP reading.  Derl Barrow, RN

## 2019-02-06 ENCOUNTER — Other Ambulatory Visit: Payer: Self-pay | Admitting: Family Medicine

## 2019-02-24 ENCOUNTER — Encounter (INDEPENDENT_AMBULATORY_CARE_PROVIDER_SITE_OTHER): Payer: Self-pay

## 2019-02-24 ENCOUNTER — Other Ambulatory Visit: Payer: Self-pay

## 2019-02-24 ENCOUNTER — Encounter: Payer: Self-pay | Admitting: Obstetrics and Gynecology

## 2019-02-24 ENCOUNTER — Telehealth (INDEPENDENT_AMBULATORY_CARE_PROVIDER_SITE_OTHER): Payer: Medicaid Other | Admitting: Obstetrics and Gynecology

## 2019-02-24 VITALS — BP 155/106 | HR 63

## 2019-02-24 DIAGNOSIS — I10 Essential (primary) hypertension: Secondary | ICD-10-CM

## 2019-02-24 DIAGNOSIS — Z30011 Encounter for initial prescription of contraceptive pills: Secondary | ICD-10-CM

## 2019-02-24 DIAGNOSIS — Z1389 Encounter for screening for other disorder: Secondary | ICD-10-CM | POA: Diagnosis not present

## 2019-02-24 MED ORDER — ENALAPRIL MALEATE 10 MG PO TABS: 10.0000 mg | ORAL_TABLET | Freq: Every day | ORAL | 2 refills | Status: DC

## 2019-02-24 MED ORDER — NORETHINDRONE 0.35 MG PO TABS: 1.0000 | ORAL_TABLET | Freq: Every day | ORAL | 6 refills | Status: DC

## 2019-02-24 NOTE — Progress Notes (Signed)
     MY CHART VIDEO VIRTUAL POSTPARTUM VISIT ENCOUNTER NOTE  I connected with Madison Allen on 02/24/19 at  1:10 PM EDT by My Chart video at home and verified that I am speaking with the correct person using two identifiers.   I discussed the limitations, risks, security and privacy concerns of performing an evaluation and management service by My Chart video and the availability of in person appointments. I also discussed with the patient that there may be a patient responsible charge related to this service. The patient expressed understanding and agreed to proceed.   History:  Madison Allen is a 28 y.o. [id number] female who presents for a postpartum visit. She is 6 weeks postpartum following a vaginal delivery. I have fully reviewed the prenatal and intrapartum course. The delivery was at 40 gestational weeks.  Anesthesia: none. Postpartum course has been unremarkable. Baby's course has been uncomplicated. Baby is feeding by breastfeeding. Bleeding no. Bowel function is normal. Bladder function is normal. Patient is not sexually active. Contraception method choice is unsure. Postpartum depression screening: negative: score 4.        Past Medical History:  Diagnosis Date  . Asthma   . Hypertension    Past Surgical History:  Procedure Laterality Date  . NO PAST SURGERIES     The following portions of the patient's history were reviewed and updated as appropriate: allergies, current medications, past family history, past medical history, past social history, past surgical history and problem list.   Health Maintenance:  Normal pap on 07/17/2018.  BP (!) 155/106 (BP Location: Right Arm, Patient Position: Sitting, Cuff Size: Normal)   Pulse 63   Breastfeeding Yes   Taken by pt at mother's house  Prior BP check on 01/27/19: BP 137/93, pulse 72  Review of Systems:  Pertinent items noted in HPI and remainder of comprehensive ROS otherwise negative.  Physical Exam:  Physical exam deferred  due to nature of the encounter  Assessment and Plan:   Chronic hypertension  - Change Rx for enalapril (VASOTEC) 10 MG tablet, - Consult with Dr. Rip Harbour @ 1407 - notified of patient's cHTN and BP readings, tx plan increase enalapril from 5 mg to 10 mg, send BP reading 1 hour after taking it via My Chart, refer to [facility], Rx PoPs until cHTN is well-managed - agrees with plan - Referral to [facility]  Encounter for postpartum visit   - Normal postpartum visit - Requiring management of cHTN as stated above  Encounter for initial prescription of contraceptive pills  - Plan: norethindrone (MICRONOR) 0.35 MG tablet    I discussed the assessment and treatment plan with the patient. The patient was provided an opportunity to ask questions and all were answered. The patient agreed with the plan and demonstrated an understanding of the instructions.   The patient was advised to call back or seek an in-person evaluation/go to the ED if the symptoms worsen or if the condition fails to improve as anticipated.  I provided 10 minutes of non-face-to-face time during this encounter. There was 5 minutes of chart review time spent prior to this encounter. Total time spent = 15 minutes.   Laury Deep, [place] [facility]

## 2019-02-24 NOTE — Patient Instructions (Signed)
DASH Eating Plan DASH stands for "Dietary Approaches to Stop Hypertension." The DASH eating plan is a healthy eating plan that has been shown to reduce high blood pressure (hypertension). It may also reduce your risk for type 2 diabetes, heart disease, and stroke. The DASH eating plan may also help with weight loss. What are tips for following this plan?  General guidelines  Avoid eating more than 2,300 mg (milligrams) of salt (sodium) a day. If you have hypertension, you may need to reduce your sodium intake to 1,500 mg a day.  Limit alcohol intake to no more than 1 drink a day for nonpregnant women and 2 drinks a day for men. One drink equals 12 oz of beer, 5 oz of wine, or 1 oz of hard liquor.  Work with your health care provider to maintain a healthy body weight or to lose weight. Ask what an ideal weight is for you.  Get at least 30 minutes of exercise that causes your heart to beat faster (aerobic exercise) most days of the week. Activities may include walking, swimming, or biking.  Work with your health care provider or diet and nutrition specialist (dietitian) to adjust your eating plan to your individual calorie needs. Reading food labels   Check food labels for the amount of sodium per serving. Choose foods with less than 5 percent of the Daily Value of sodium. Generally, foods with less than 300 mg of sodium per serving fit into this eating plan.  To find whole grains, look for the word "whole" as the first word in the ingredient list. Shopping  Buy products labeled as "low-sodium" or "no salt added."  Buy fresh foods. Avoid canned foods and premade or frozen meals. Cooking  Avoid adding salt when cooking. Use salt-free seasonings or herbs instead of table salt or sea salt. Check with your health care provider or pharmacist before using salt substitutes.  Do not fry foods. Cook foods using healthy methods such as baking, boiling, grilling, and broiling instead.  Cook with  heart-healthy oils, such as olive, canola, soybean, or sunflower oil. Meal planning  Eat a balanced diet that includes: ? 5 or more servings of fruits and vegetables each day. At each meal, try to fill half of your plate with fruits and vegetables. ? Up to 6-8 servings of whole grains each day. ? Less than 6 oz of lean meat, poultry, or fish each day. A 3-oz serving of meat is about the same size as a deck of cards. One egg equals 1 oz. ? 2 servings of low-fat dairy each day. ? A serving of nuts, seeds, or beans 5 times each week. ? Heart-healthy fats. Healthy fats called Omega-3 fatty acids are found in foods such as flaxseeds and coldwater fish, like sardines, salmon, and mackerel.  Limit how much you eat of the following: ? Canned or prepackaged foods. ? Food that is high in trans fat, such as fried foods. ? Food that is high in saturated fat, such as fatty meat. ? Sweets, desserts, sugary drinks, and other foods with added sugar. ? Full-fat dairy products.  Do not salt foods before eating.  Try to eat at least 2 vegetarian meals each week.  Eat more home-cooked food and less restaurant, buffet, and fast food.  When eating at a restaurant, ask that your food be prepared with less salt or no salt, if possible. What foods are recommended? The items listed may not be a complete list. Talk with your dietitian about   what dietary choices are best for you. Grains Whole-grain or whole-wheat bread. Whole-grain or whole-wheat pasta. Brown rice. Oatmeal. Quinoa. Bulgur. Whole-grain and low-sodium cereals. Pita bread. Low-fat, low-sodium crackers. Whole-wheat flour tortillas. Vegetables Fresh or frozen vegetables (raw, steamed, roasted, or grilled). Low-sodium or reduced-sodium tomato and vegetable juice. Low-sodium or reduced-sodium tomato sauce and tomato paste. Low-sodium or reduced-sodium canned vegetables. Fruits All fresh, dried, or frozen fruit. Canned fruit in natural juice (without  added sugar). Meat and other protein foods Skinless chicken or turkey. Ground chicken or turkey. Pork with fat trimmed off. Fish and seafood. Egg whites. Dried beans, peas, or lentils. Unsalted nuts, nut butters, and seeds. Unsalted canned beans. Lean cuts of beef with fat trimmed off. Low-sodium, lean deli meat. Dairy Low-fat (1%) or fat-free (skim) milk. Fat-free, low-fat, or reduced-fat cheeses. Nonfat, low-sodium ricotta or cottage cheese. Low-fat or nonfat yogurt. Low-fat, low-sodium cheese. Fats and oils Soft margarine without trans fats. Vegetable oil. Low-fat, reduced-fat, or light mayonnaise and salad dressings (reduced-sodium). Canola, safflower, olive, soybean, and sunflower oils. Avocado. Seasoning and other foods Herbs. Spices. Seasoning mixes without salt. Unsalted popcorn and pretzels. Fat-free sweets. What foods are not recommended? The items listed may not be a complete list. Talk with your dietitian about what dietary choices are best for you. Grains Baked goods made with fat, such as croissants, muffins, or some breads. Dry pasta or rice meal packs. Vegetables Creamed or fried vegetables. Vegetables in a cheese sauce. Regular canned vegetables (not low-sodium or reduced-sodium). Regular canned tomato sauce and paste (not low-sodium or reduced-sodium). Regular tomato and vegetable juice (not low-sodium or reduced-sodium). Pickles. Olives. Fruits Canned fruit in a light or heavy syrup. Fried fruit. Fruit in cream or butter sauce. Meat and other protein foods Fatty cuts of meat. Ribs. Fried meat. Bacon. Sausage. Bologna and other processed lunch meats. Salami. Fatback. Hotdogs. Bratwurst. Salted nuts and seeds. Canned beans with added salt. Canned or smoked fish. Whole eggs or egg yolks. Chicken or turkey with skin. Dairy Whole or 2% milk, cream, and half-and-half. Whole or full-fat cream cheese. Whole-fat or sweetened yogurt. Full-fat cheese. Nondairy creamers. Whipped toppings.  Processed cheese and cheese spreads. Fats and oils Butter. Stick margarine. Lard. Shortening. Ghee. Bacon fat. Tropical oils, such as coconut, palm kernel, or palm oil. Seasoning and other foods Salted popcorn and pretzels. Onion salt, garlic salt, seasoned salt, table salt, and sea salt. Worcestershire sauce. Tartar sauce. Barbecue sauce. Teriyaki sauce. Soy sauce, including reduced-sodium. Steak sauce. Canned and packaged gravies. Fish sauce. Oyster sauce. Cocktail sauce. Horseradish that you find on the shelf. Ketchup. Mustard. Meat flavorings and tenderizers. Bouillon cubes. Hot sauce and Tabasco sauce. Premade or packaged marinades. Premade or packaged taco seasonings. Relishes. Regular salad dressings. Where to find more information:  National Heart, Lung, and Blood Institute: www.nhlbi.nih.gov  American Heart Association: www.heart.org Summary  The DASH eating plan is a healthy eating plan that has been shown to reduce high blood pressure (hypertension). It may also reduce your risk for type 2 diabetes, heart disease, and stroke.  With the DASH eating plan, you should limit salt (sodium) intake to 2,300 mg a day. If you have hypertension, you may need to reduce your sodium intake to 1,500 mg a day.  When on the DASH eating plan, aim to eat more fresh fruits and vegetables, whole grains, lean proteins, low-fat dairy, and heart-healthy fats.  Work with your health care provider or diet and nutrition specialist (dietitian) to adjust your eating plan to your   individual calorie needs. This information is not intended to replace advice given to you by your health care provider. Make sure you discuss any questions you have with your health care provider. Document Released: 08/08/2011 Document Revised: 08/12/2016 Document Reviewed: 08/12/2016 Elsevier Interactive Patient Education  2019 [employer]. Managing Your Hypertension Hypertension is commonly called high blood pressure. This is when  the force of your blood pressing against the walls of your arteries is too strong. Arteries are blood vessels that carry blood from your heart throughout your body. Hypertension forces the heart to work harder to pump blood, and may cause the arteries to become narrow or stiff. Having untreated or uncontrolled hypertension can cause heart attack, stroke, kidney disease, and other problems. What are blood pressure readings? A blood pressure reading consists of a higher number over a lower number. Ideally, your blood pressure should be below 120/80. The first ("top") number is called the systolic pressure. It is a measure of the pressure in your arteries as your heart beats. The second ("bottom") number is called the diastolic pressure. It is a measure of the pressure in your arteries as the heart relaxes. What does my blood pressure reading mean? Blood pressure is classified into four stages. Based on your blood pressure reading, your health care provider may use the following stages to determine what type of treatment you need, if any. Systolic pressure and diastolic pressure are measured in a unit called mm Hg. Normal  Systolic pressure: below 361.  Diastolic pressure: below 80. Elevated  Systolic pressure: 443-154.  Diastolic pressure: below 80. Hypertension stage 1  Systolic pressure: 008-676.  Diastolic pressure: 19-50. Hypertension stage 2  Systolic pressure: 932 or above.  Diastolic pressure: 90 or above. What health risks are associated with hypertension? Managing your hypertension is an important responsibility. Uncontrolled hypertension can lead to:  A heart attack.  A stroke.  A weakened blood vessel (aneurysm).  Heart failure.  Kidney damage.  Eye damage.  Metabolic syndrome.  Memory and concentration problems. What changes can I make to manage my hypertension? Hypertension can be managed by making lifestyle changes and possibly by taking medicines. Your health  care provider will help you make a plan to bring your blood pressure within a normal range. Eating and drinking   Eat a diet that is high in fiber and potassium, and low in salt (sodium), added sugar, and fat. An example eating plan is called the DASH (Dietary Approaches to Stop Hypertension) diet. To eat this way: ? Eat plenty of fresh fruits and vegetables. Try to fill half of your plate at each meal with fruits and vegetables. ? Eat whole grains, such as whole wheat pasta, brown rice, or whole grain bread. Fill about one quarter of your plate with whole grains. ? Eat low-fat diary products. ? Avoid fatty cuts of meat, processed or cured meats, and poultry with skin. Fill about one quarter of your plate with lean proteins such as fish, chicken without skin, beans, eggs, and tofu. ? Avoid premade and processed foods. These tend to be higher in sodium, added sugar, and fat.  Reduce your daily sodium intake. Most people with hypertension should eat less than 1,500 mg of sodium a day.  Limit alcohol intake to no more than 1 drink a day for nonpregnant women and 2 drinks a day for men. One drink equals 12 oz of beer, 5 oz of wine, or 1 oz of hard liquor. Lifestyle  Work with your health care provider  to maintain a healthy body weight, or to lose weight. Ask what an ideal weight is for you.  Get at least 30 minutes of exercise that causes your heart to beat faster (aerobic exercise) most days of the week. Activities may include walking, swimming, or biking.  Include exercise to strengthen your muscles (resistance exercise), such as weight lifting, as part of your weekly exercise routine. Try to do these types of exercises for 30 minutes at least 3 days a week.  Do not use any products that contain nicotine or tobacco, such as cigarettes and e-cigarettes. If you need help quitting, ask your health care provider.  Control any long-term (chronic) conditions you have, such as high cholesterol or  diabetes. Monitoring  Monitor your blood pressure at home as told by your health care provider. Your personal target blood pressure may vary depending on your medical conditions, your age, and other factors.  Have your blood pressure checked regularly, as often as told by your health care provider. Working with your health care provider  Review all the medicines you take with your health care provider because there may be side effects or interactions.  Talk with your health care provider about your diet, exercise habits, and other lifestyle factors that may be contributing to hypertension.  Visit your health care provider regularly. Your health care provider can help you create and adjust your plan for managing hypertension. Will I need medicine to control my blood pressure? Your health care provider may prescribe medicine if lifestyle changes are not enough to get your blood pressure under control, and if:  Your systolic blood pressure is 130 or higher.  Your diastolic blood pressure is 80 or higher. Take medicines only as told by your health care provider. Follow the directions carefully. Blood pressure medicines must be taken as prescribed. The medicine does not work as well when you skip doses. Skipping doses also puts you at risk for problems. Contact a health care provider if:  You think you are having a reaction to medicines you have taken.  You have repeated (recurrent) headaches.  You feel dizzy.  You have swelling in your ankles.  You have trouble with your vision. Get help right away if:  You develop a severe headache or confusion.  You have unusual weakness or numbness, or you feel faint.  You have severe pain in your chest or abdomen.  You vomit repeatedly.  You have trouble breathing. Summary  Hypertension is when the force of blood pumping through your arteries is too strong. If this condition is not controlled, it may put you at risk for serious  complications.  Your personal target blood pressure may vary depending on your medical conditions, your age, and other factors. For most people, a normal blood pressure is less than 120/80.  Hypertension is managed by lifestyle changes, medicines, or both. Lifestyle changes include weight loss, eating a healthy, low-sodium diet, exercising more, and limiting alcohol. This information is not intended to replace advice given to you by your health care provider. Make sure you discuss any questions you have with your health care provider. Document Released: 05/13/2012 Document Revised: 07/17/2016 Document Reviewed: 07/17/2016 Elsevier Interactive Patient Education  2019 [employer]. Hypertension Hypertension, commonly called high blood pressure, is when the force of blood pumping through the arteries is too strong. The arteries are the blood vessels that carry blood from the heart throughout the body. Hypertension forces the heart to work harder to pump blood and may cause arteries  to become narrow or stiff. Having untreated or uncontrolled hypertension can cause heart attacks, strokes, kidney disease, and other problems. A blood pressure reading consists of a higher number over a lower number. Ideally, your blood pressure should be below 120/80. The first ("top") number is called the systolic pressure. It is a measure of the pressure in your arteries as your heart beats. The second ("bottom") number is called the diastolic pressure. It is a measure of the pressure in your arteries as the heart relaxes. What are the causes? The cause of this condition is not known. What increases the risk? Some risk factors for high blood pressure are under your control. Others are not. Factors you can change  Smoking.  Having type 2 diabetes mellitus, high cholesterol, or both.  Not getting enough exercise or physical activity.  Being overweight.  Having too much fat, sugar, calories, or salt (sodium) in your  diet.  Drinking too much alcohol. Factors that are difficult or impossible to change  Having chronic kidney disease.  Having a family history of high blood pressure.  Age. Risk increases with age.  Race. You may be at higher risk if you are African-American.  Gender. Men are at higher risk than women before age 54. After age 12, women are at higher risk than men.  Having obstructive sleep apnea.  Stress. What are the signs or symptoms? Extremely high blood pressure (hypertensive crisis) may cause:  Headache.  Anxiety.  Shortness of breath.  Nosebleed.  Nausea and vomiting.  Severe chest pain.  Jerky movements you cannot control (seizures). How is this diagnosed? This condition is diagnosed by measuring your blood pressure while you are seated, with your arm resting on a surface. The cuff of the blood pressure monitor will be placed directly against the skin of your upper arm at the level of your heart. It should be measured at least twice using the same arm. Certain conditions can cause a difference in blood pressure between your right and left arms. Certain factors can cause blood pressure readings to be lower or higher than normal (elevated) for a short period of time:  When your blood pressure is higher when you are in a health care provider's office than when you are at home, this is called white coat hypertension. Most people with this condition do not need medicines.  When your blood pressure is higher at home than when you are in a health care provider's office, this is called masked hypertension. Most people with this condition may need medicines to control blood pressure. If you have a high blood pressure reading during one visit or you have normal blood pressure with other risk factors:  You may be asked to return on a different day to have your blood pressure checked again.  You may be asked to monitor your blood pressure at home for 1 week or longer. If you  are diagnosed with hypertension, you may have other blood or imaging tests to help your health care provider understand your overall risk for other conditions. How is this treated? This condition is treated by making healthy lifestyle changes, such as eating healthy foods, exercising more, and reducing your alcohol intake. Your health care provider may prescribe medicine if lifestyle changes are not enough to get your blood pressure under control, and if:  Your systolic blood pressure is above 130.  Your diastolic blood pressure is above 80. Your personal target blood pressure may vary depending on your medical conditions, your age,  and other factors. Follow these instructions at home: Eating and drinking   Eat a diet that is high in fiber and potassium, and low in sodium, added sugar, and fat. An example eating plan is called the DASH (Dietary Approaches to Stop Hypertension) diet. To eat this way: ? Eat plenty of fresh fruits and vegetables. Try to fill half of your plate at each meal with fruits and vegetables. ? Eat whole grains, such as whole wheat pasta, brown rice, or whole grain bread. Fill about one quarter of your plate with whole grains. ? Eat or drink low-fat dairy products, such as skim milk or low-fat yogurt. ? Avoid fatty cuts of meat, processed or cured meats, and poultry with skin. Fill about one quarter of your plate with lean proteins, such as fish, chicken without skin, beans, eggs, and tofu. ? Avoid premade and processed foods. These tend to be higher in sodium, added sugar, and fat.  Reduce your daily sodium intake. Most people with hypertension should eat less than 1,500 mg of sodium a day.  Limit alcohol intake to no more than 1 drink a day for nonpregnant women and 2 drinks a day for men. One drink equals 12 oz of beer, 5 oz of wine, or 1 oz of hard liquor. Lifestyle   Work with your health care provider to maintain a healthy body weight or to lose weight. Ask what  an ideal weight is for you.  Get at least 30 minutes of exercise that causes your heart to beat faster (aerobic exercise) most days of the week. Activities may include walking, swimming, or biking.  Include exercise to strengthen your muscles (resistance exercise), such as pilates or lifting weights, as part of your weekly exercise routine. Try to do these types of exercises for 30 minutes at least 3 days a week.  Do not use any products that contain nicotine or tobacco, such as cigarettes and e-cigarettes. If you need help quitting, ask your health care provider.  Monitor your blood pressure at home as told by your health care provider.  Keep all follow-up visits as told by your health care provider. This is important. Medicines  Take over-the-counter and prescription medicines only as told by your health care provider. Follow directions carefully. Blood pressure medicines must be taken as prescribed.  Do not skip doses of blood pressure medicine. Doing this puts you at risk for problems and can make the medicine less effective.  Ask your health care provider about side effects or reactions to medicines that you should watch for. Contact a health care provider if:  You think you are having a reaction to a medicine you are taking.  You have headaches that keep coming back (recurring).  You feel dizzy.  You have swelling in your ankles.  You have trouble with your vision. Get help right away if:  You develop a severe headache or confusion.  You have unusual weakness or numbness.  You feel faint.  You have severe pain in your chest or abdomen.  You vomit repeatedly.  You have trouble breathing. Summary  Hypertension is when the force of blood pumping through your arteries is too strong. If this condition is not controlled, it may put you at risk for serious complications.  Your personal target blood pressure may vary depending on your medical conditions, your age, and  other factors. For most people, a normal blood pressure is less than 120/80.  Hypertension is treated with lifestyle changes, medicines, or a combination  of both. Lifestyle changes include weight loss, eating a healthy, low-sodium diet, exercising more, and limiting alcohol. This information is not intended to replace advice given to you by your health care provider. Make sure you discuss any questions you have with your health care provider. Document Released: 08/19/2005 Document Revised: 07/17/2016 Document Reviewed: 07/17/2016 Elsevier Interactive Patient Education  2019 [employer].

## 2019-03-08 ENCOUNTER — Encounter (INDEPENDENT_AMBULATORY_CARE_PROVIDER_SITE_OTHER): Payer: Self-pay | Admitting: Primary Care

## 2019-03-08 ENCOUNTER — Telehealth (INDEPENDENT_AMBULATORY_CARE_PROVIDER_SITE_OTHER): Payer: Medicaid Other | Admitting: Primary Care

## 2019-03-08 DIAGNOSIS — I10 Essential (primary) hypertension: Secondary | ICD-10-CM | POA: Diagnosis not present

## 2019-03-08 DIAGNOSIS — Z7689 Persons encountering health services in other specified circumstances: Secondary | ICD-10-CM | POA: Diagnosis not present

## 2019-03-08 MED ORDER — NIFEDIPINE ER OSMOTIC RELEASE 30 MG PO TB24: 30.0000 mg | ORAL_TABLET | Freq: Every day | ORAL | 3 refills | Status: DC

## 2019-03-08 NOTE — Progress Notes (Signed)
Virtual Visit via Telephone Note  I connected with Madison Allen on 03/08/19 at  2:50 PM EDT by telephone and verified that I am speaking with the correct person using two identifiers.   I discussed the limitations, risks, security and privacy concerns of performing an evaluation and management service by telephone and the availability of in person appointments. I also discussed with the patient that there may be a patient responsible charge related to this service. The patient expressed understanding and agreed to proceed.   History of Present Illness: Madison Allen is being referred by [facility] she had her labor induce due to fetal distress and elevated hypertension.  She is breastfeeding and continued to   monitored for the following issues for this  antepartum and Chronic hypertension has been referred to establish care with PCP.   Observations/Objective: Review of Systems  Neurological: Positive for headaches.    Assessment and Plan: Diagnoses and all orders for this visit:  Essential hypertension Counseled on blood pressure goal of less than 130/80, low-sodium, DASH diet, medication compliance, 150 minutes of moderate intensity exercise per week.Patient is able to take Bp at home she will call in 2 weeks for readings on new medication for Bp Discussed medication compliance, adverse effects. -     NIFEdipine (PROCARDIA XL) 30 MG 24 hr tablet; Take 1 tablet (30 mg total) by mouth daily.  Encounter to establish care Establish primary care to manage hypertension pre-eclampsia during preganacy  Patient is a currently breast-feeding mother Nifedipine is safe during pregnancy and breast feeding.    Follow Up Instructions:    I discussed the assessment and treatment plan with the patient. The patient was provided an opportunity to ask questions and all were answered. The patient agreed with the plan and demonstrated an understanding of the  instructions.   The patient was advised to call back or seek an in-person evaluation if the symptoms worsen or if the condition fails to improve as anticipated.  I provided 16 minutes of non-face-to-face time during this encounter. Reviewed encounters labs and Bp    Kerin Perna, NP

## 2019-03-24 IMAGING — US US MFM OB FOLLOW-UP
1 series · 14 of 28 positions shown · non-contrast
Comparison: none

[Series 1: us mfm ob follow-up · 14 of 48 slices shown]
[im 2/48]
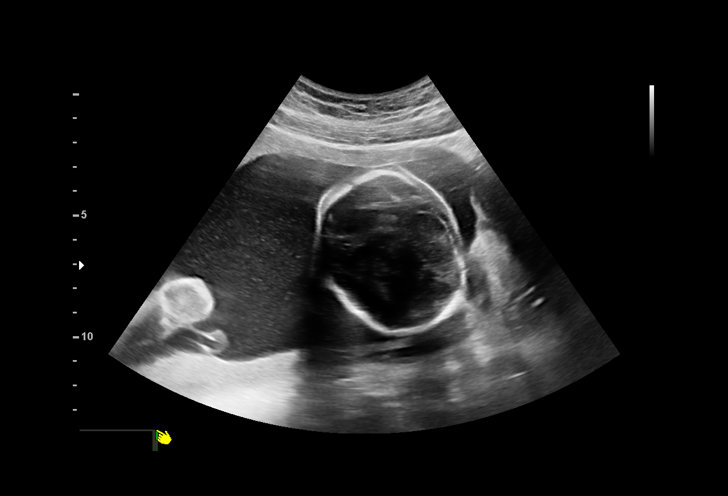
[im 6/48]
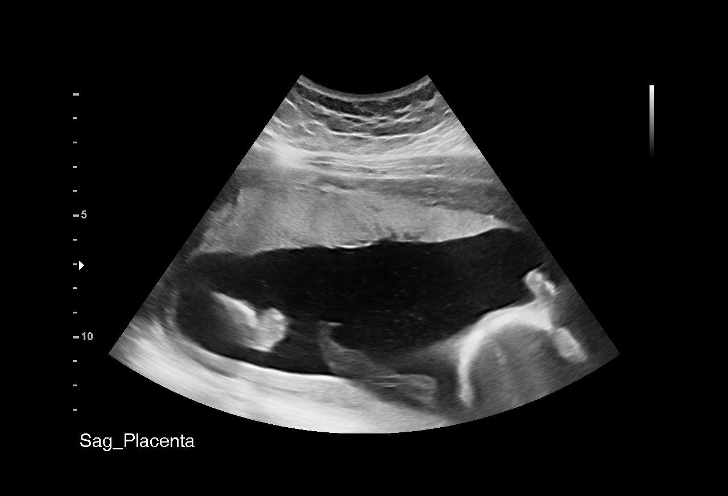
[im 9/48]
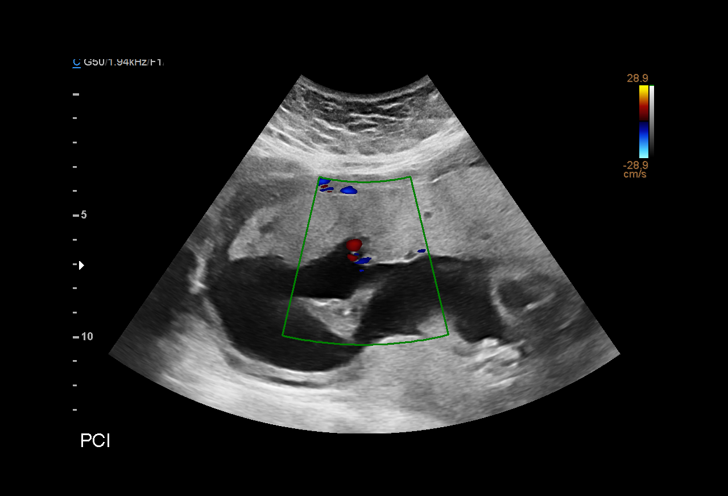
[im 13/48]
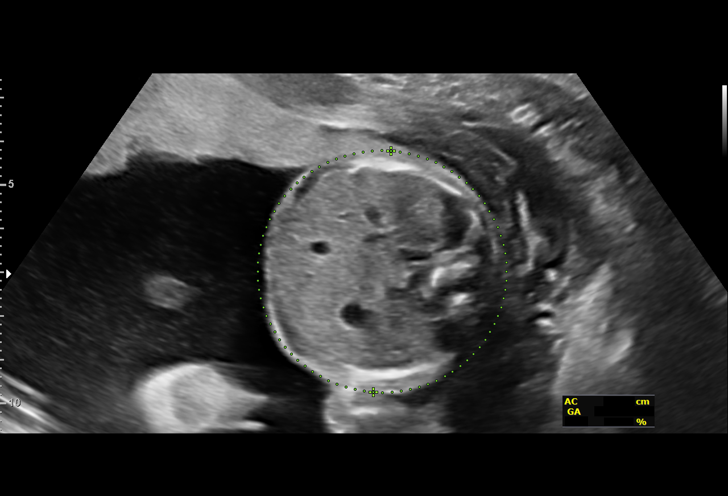
[im 16/48]
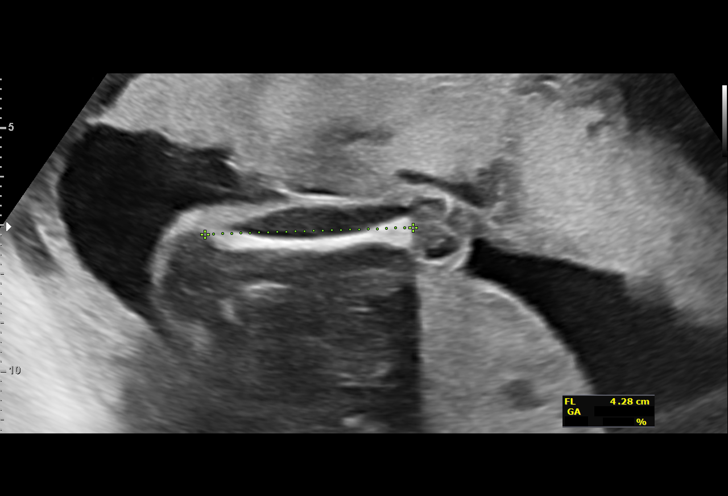
[im 20/48]
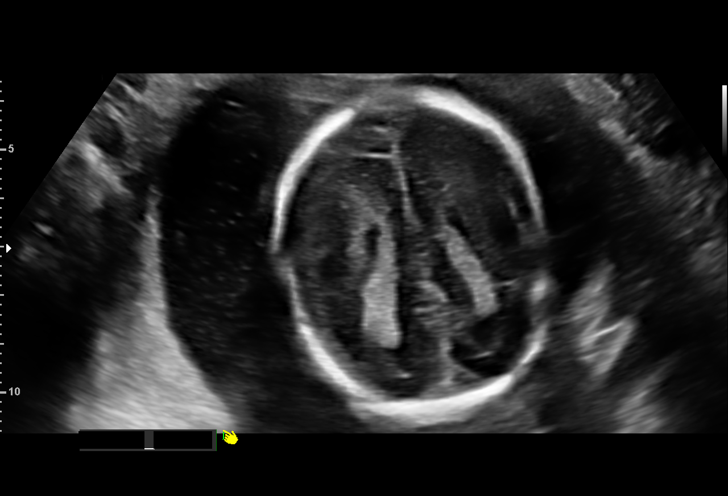
[im 23/48]
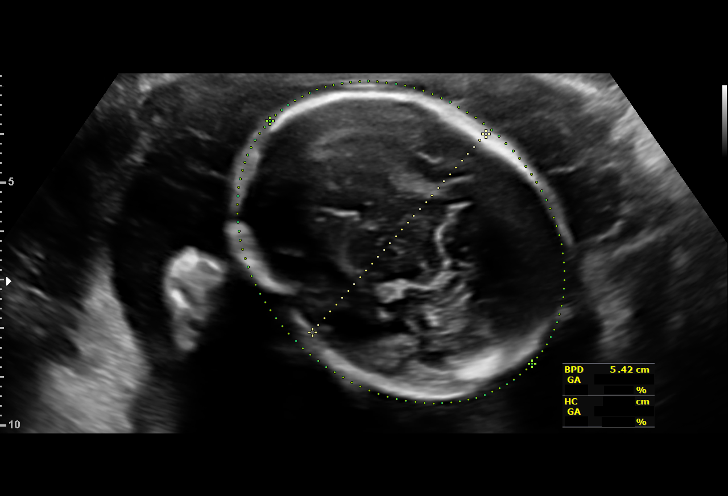
[im 27/48]
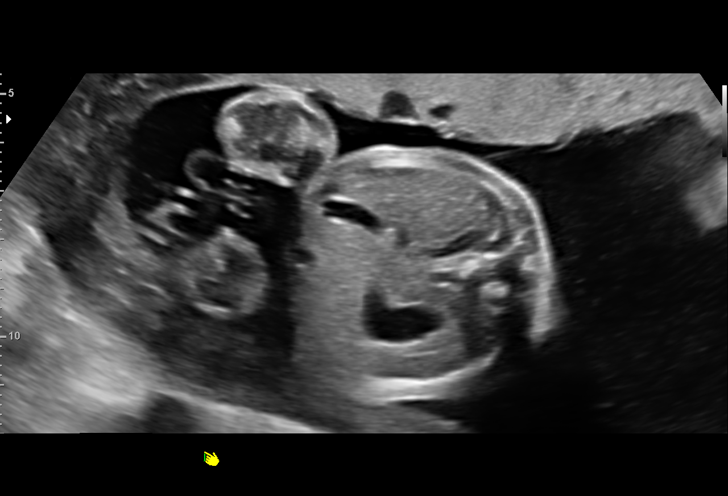
[im 30/48]
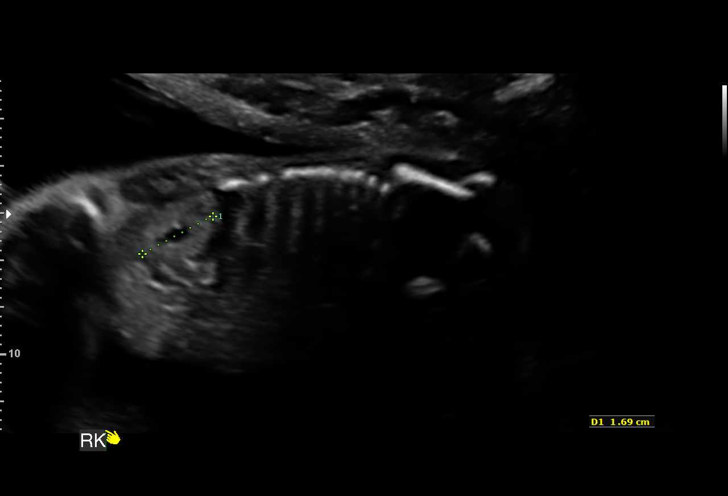
[im 34/48]
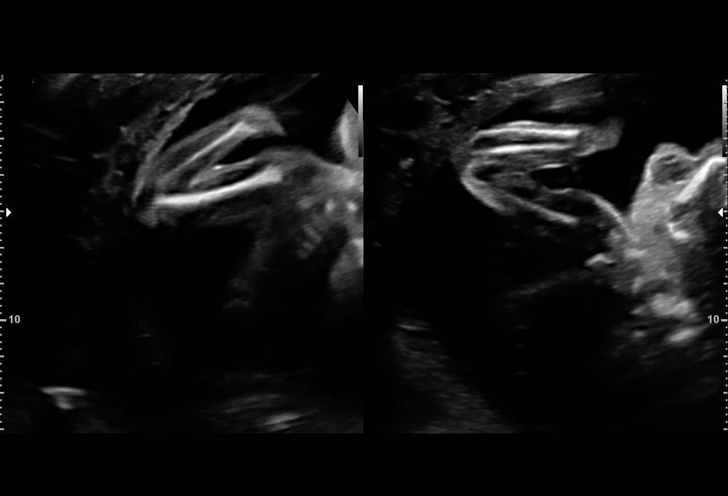
[im 37/48]
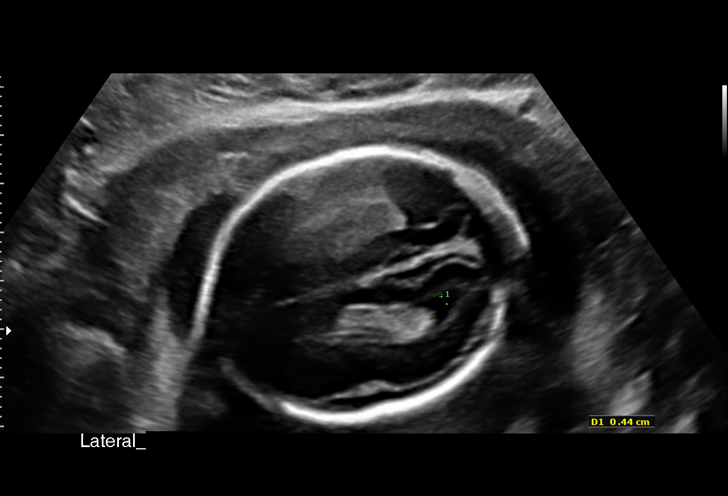
[im 41/48]
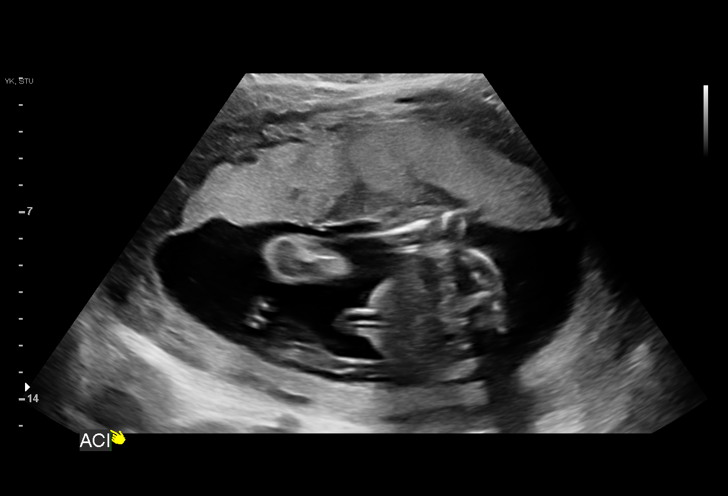
[im 44/48]
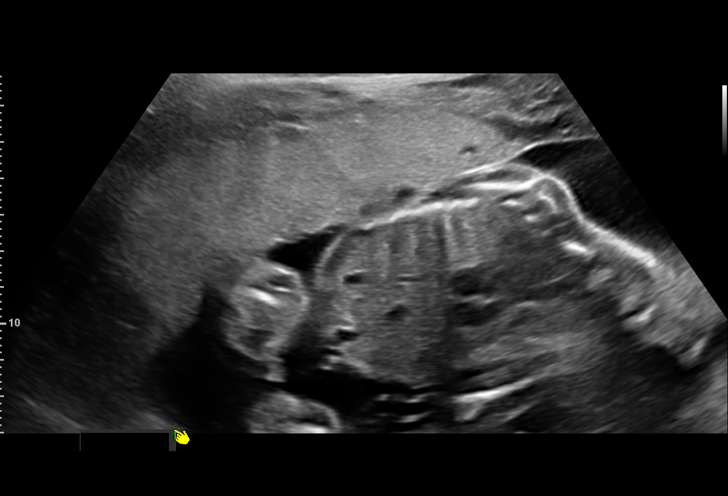
[im 48/48]
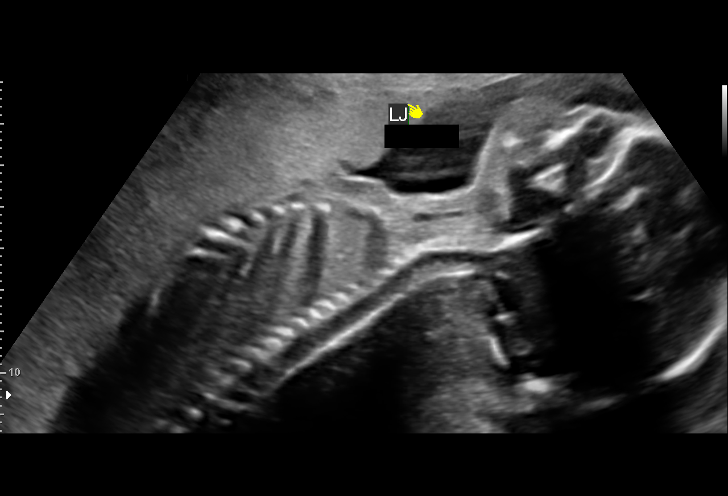

[14 of 28 positions shown; findings below may reference images not displayed]

JUMPER
                   CNM

 ----------------------------------------------------------------------

 ----------------------------------------------------------------------
Indications

  Unspecified maternal hypertension, second
  trimester
  23 weeks gestation of pregnancy
 ----------------------------------------------------------------------
Fetal Evaluation

 Num Of Fetuses:         1
 Fetal Heart Rate(bpm):  134
 Cardiac Activity:       Observed
 Presentation:           Cephalic
 Placenta:               Anterior Fundal

 Amniotic Fluid
 AFI FV:      Within normal limits

                             Largest Pocket(cm)

Biometry

 BPD:      54.8  mm     G. Age:  22w 5d         19  %    CI:        69.85   %    70 - 86
                                                         FL/HC:      20.1   %    19.2 -
 HC:      209.2  mm     G. Age:  23w 0d         20  %    HC/AC:      1.18        1.05 -
 AC:      176.9  mm     G. Age:  22w 4d         19  %    FL/BPD:     76.6   %    71 - 87
 FL:         42  mm     G. Age:  23w 5d         47  %    FL/AC:      23.7   %    20 - 24

 Est. FW:     558  gm      1 lb 4 oz     45  %
OB History

 Gravidity:    1
Gestational Age

 LMP:           23w 3d        Date:  04/07/18                 EDD:   01/12/19
 U/S Today:     23w 0d                                        EDD:   01/15/19
 Best:          23w 3d     Det. By:  LMP  (04/07/18)          EDD:   01/12/19
Anatomy

 Cranium:               Previously seen        Aortic Arch:            Previously seen
 Cavum:                 Previously seen        Ductal Arch:            Previously seen
 Ventricles:            Appears normal         Diaphragm:              Previously seen
 Choroid Plexus:        Appears normal         Stomach:                Appears normal, left
                                                                       sided
 Cerebellum:            Previously seen        Abdomen:                Previously seen
 Posterior Fossa:       Previously seen        Abdominal Wall:         Previously seen
 Nuchal Fold:           Not applicable (>20    Cord Vessels:           Previously seen
                        wks GA)
 Face:                  Appears normal         Kidneys:                Appear normal
                        (orbits and profile)
 Lips:                  Previously seen        Bladder:                Appears normal
 Thoracic:              Appears normal         Spine:                  Previously seen
                        Previously seen
 Heart:                 Previously seen        Upper Extremities:      Previously seen
 RVOT:                  Not well visualized    Lower Extremities:      Previously seen
 LVOT:                  Not well visualized

 Other:  Male gender. Heels,  5th digits previously visualized. Nasal bone
         visualized. Lenses visualized.
Cervix Uterus Adnexa

 Cervix
 Length:              3  cm.
 Normal appearance by transabdominal scan.

 Uterus
 No abnormality visualized.
Impression

 Chronic hypertension. Well-controlled without
 antihypertensives.
 Amniotic fluid is normal and good fetal activity is seen. Fetal
 growth is appropriate for gestational age.
Recommendations

 An appointment was made for her to return in 4 weeks for
 fetal growth assessment.
                 [place], Ngwa

## 2019-03-30 ENCOUNTER — Other Ambulatory Visit: Payer: Self-pay | Admitting: Obstetrics and Gynecology

## 2019-03-30 DIAGNOSIS — Z30011 Encounter for initial prescription of contraceptive pills: Secondary | ICD-10-CM

## 2019-04-15 ENCOUNTER — Other Ambulatory Visit: Payer: Self-pay | Admitting: Obstetrics and Gynecology

## 2019-04-15 DIAGNOSIS — I10 Essential (primary) hypertension: Secondary | ICD-10-CM

## 2019-04-21 IMAGING — US US MFM OB FOLLOW-UP
1 series · 14 of 28 positions shown · non-contrast
Comparison: none

[Series 1: us mfm ob follow-up · 14 of 34 slices shown]
[im 2/34]
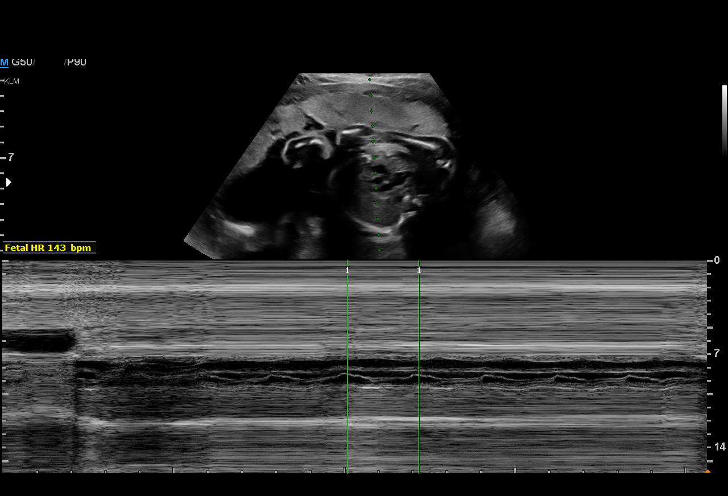
[im 4/34]
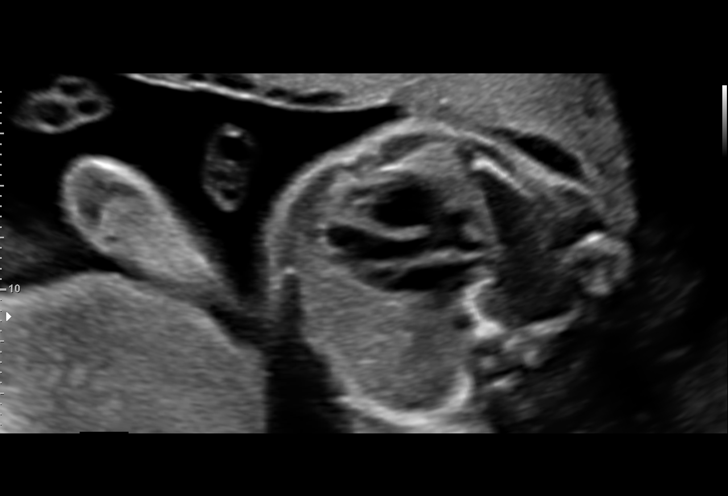
[im 7/34]
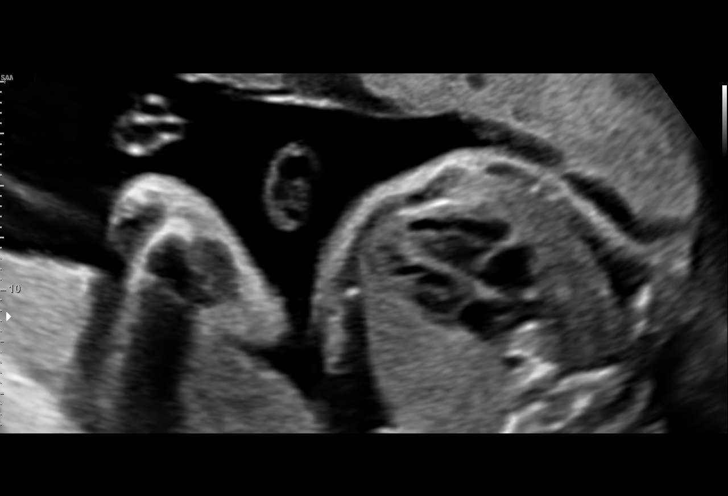
[im 9/34]
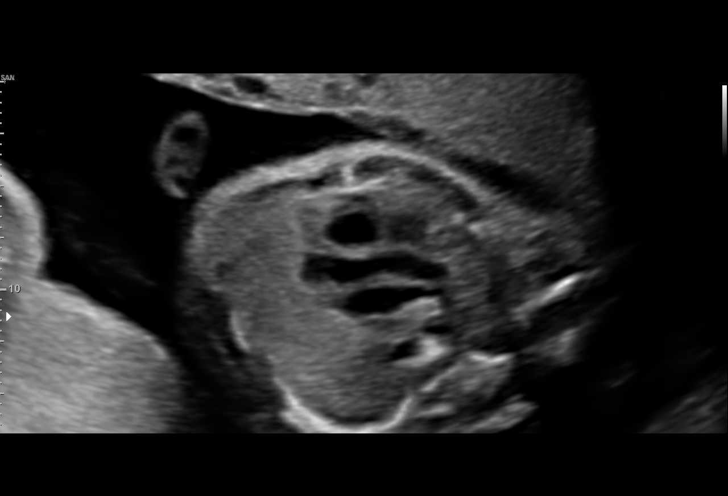
[im 12/34]
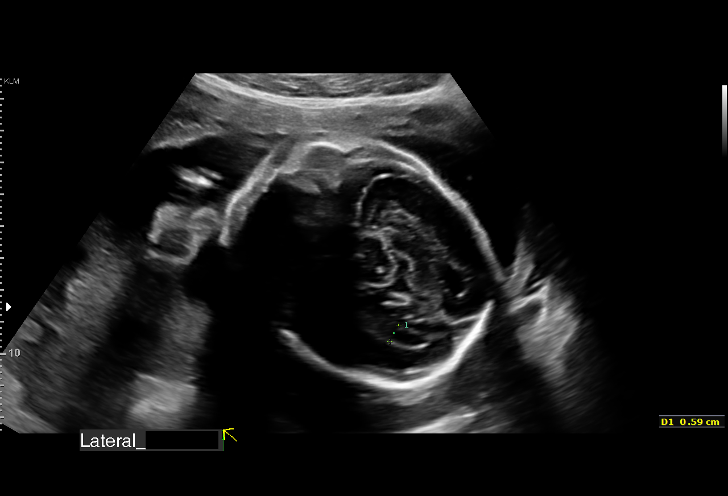
[im 14/34]
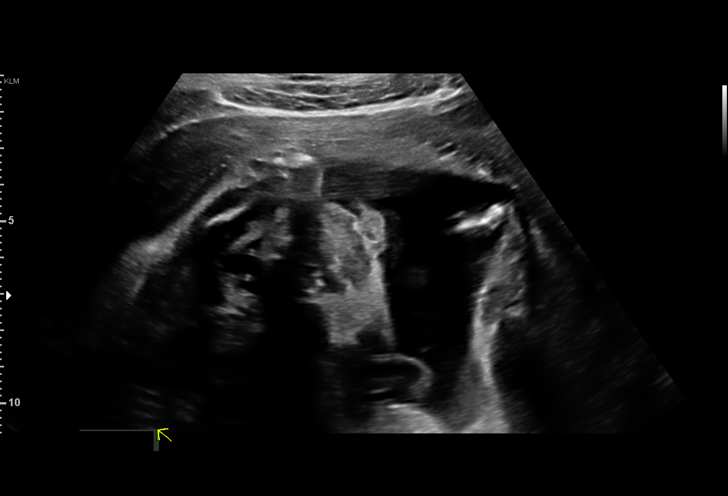
[im 16/34]
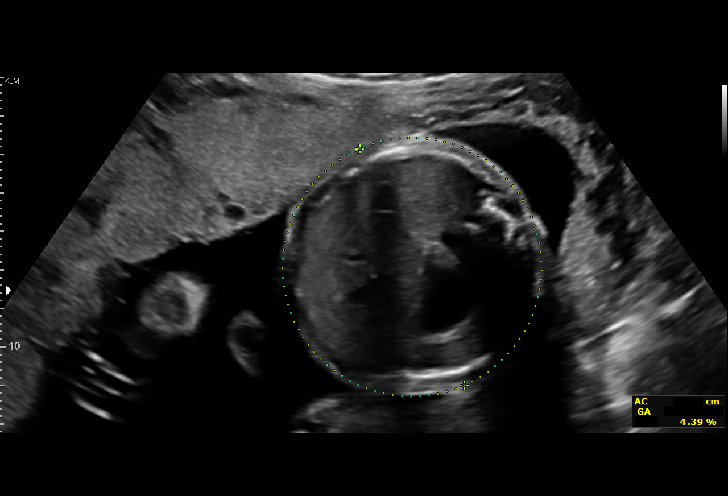
[im 19/34]
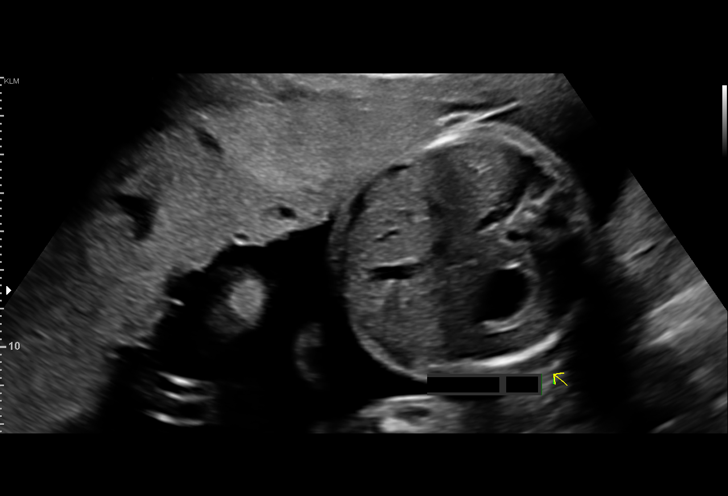
[im 21/34]
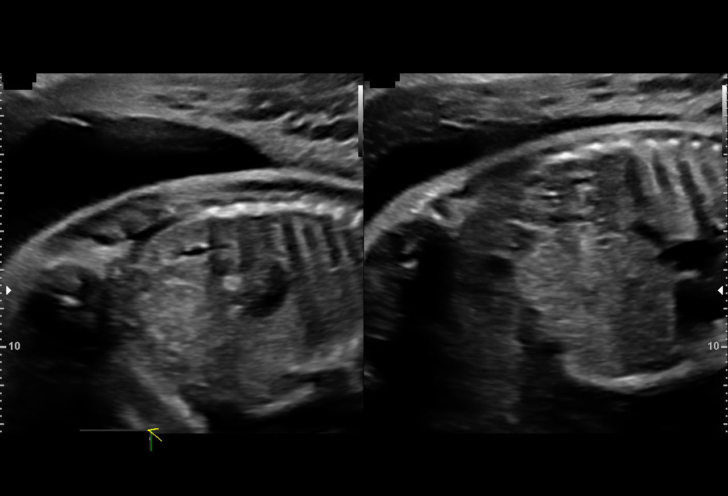
[im 24/34]
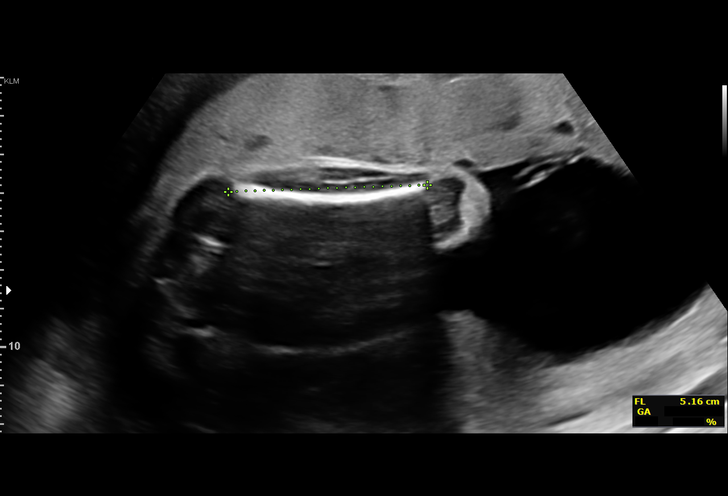
[im 26/34]
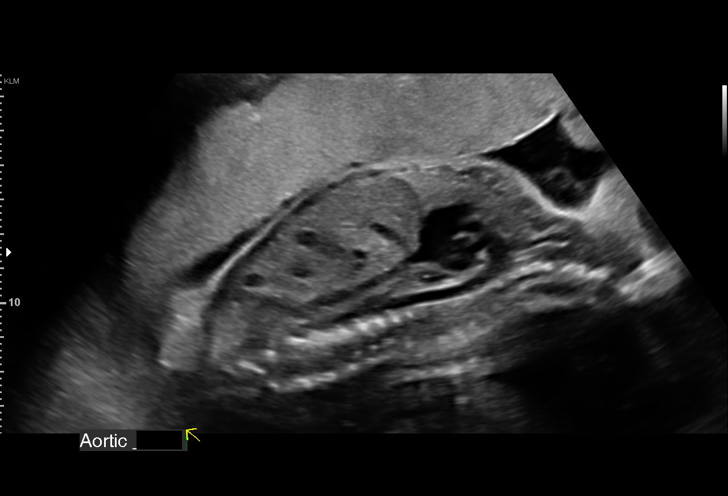
[im 29/34]
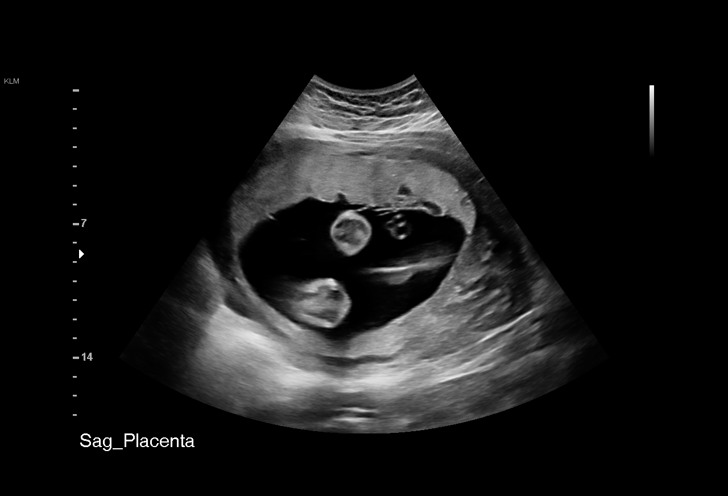
[im 31/34]
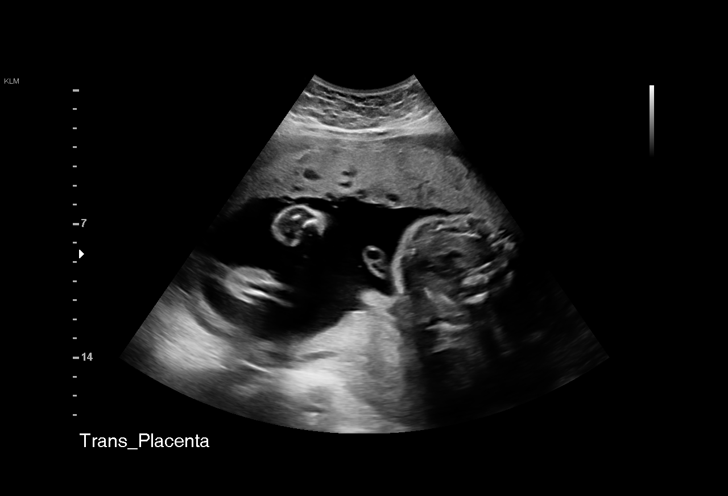
[im 34/34]
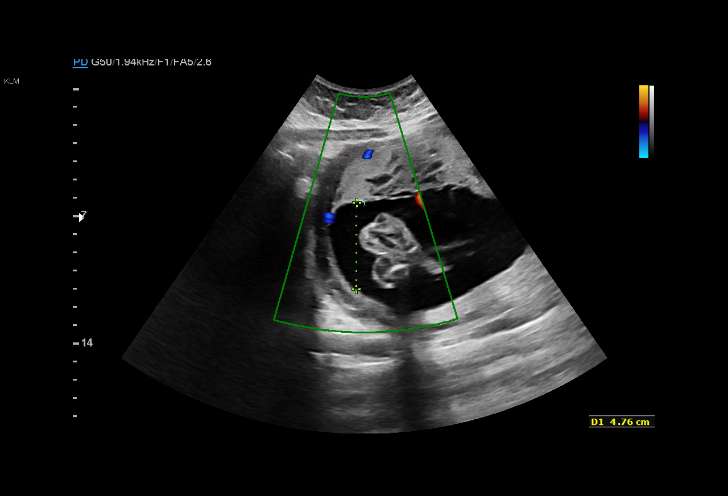

[14 of 28 positions shown; findings below may reference images not displayed]

Referred By:       OURARI TIGER
                    ARKOLAKI CNM

 ----------------------------------------------------------------------

 ----------------------------------------------------------------------
Indications

  Hypertension - Chronic/Pre-existing (ASA)
  27 weeks gestation of pregnancy
  Antenatal follow-up for nonvisualized fetal
  anatomy
 ----------------------------------------------------------------------
Fetal Evaluation

 Num Of Fetuses:          1
 Fetal Heart Rate(bpm):   143
 Cardiac Activity:        Observed
 Presentation:            Cephalic
 Placenta:                Anterior
 P. Cord Insertion:       Previously Visualized

 Amniotic Fluid
 AFI FV:      Within normal limits

                             Largest Pocket(cm)

Biometry

 BPD:      68.7   mm     G. Age:  27w 4d         45  %    CI:         75.04  %    70 - 86
                                                          FL/HC:       19.2  %    18.6 -
 HC:      251.6   mm     G. Age:  27w 3d         20  %    HC/AC:       1.15       1.05 -
 AC:      218.6   mm     G. Age:  26w 3d         14  %    FL/BPD:      70.3  %    71 - 87
 FL:       48.3   mm     G. Age:  26w 2d          9  %    FL/AC:       22.1  %    20 - 24

 Est. FW:     937   gm      2 lb 1 oz     31  %
OB History

 Gravidity:     1
Gestational Age

 LMP:            27w 3d       Date:  04/07/18                   EDD:  01/12/19
 U/S Today:      27w 0d                                         EDD:  01/15/19
 Best:           27w 3d    Det. By:  LMP  (04/07/18)            EDD:  01/12/19
Anatomy

 Cranium:                Appears normal         Aortic Arch:            Previously seen
 Cavum:                  Previously seen        Ductal Arch:            Previously seen
 Ventricles:             Appears normal         Diaphragm:              Appears normal
 Choroid Plexus:         Previously seen        Stomach:                Appears normal, left
                                                                        sided
 Cerebellum:             Previously seen        Abdomen:                Previously seen
 Posterior Fossa:        Previously seen        Abdominal Wall:         Previously seen
 Nuchal Fold:            Not applicable (>20    Cord Vessels:           Previously seen
                         wks GA)
 Face:                   Orbits and profile     Kidneys:                Appear normal
                         previously seen
 Lips:                   Previously seen        Bladder:                Appears normal
 Thoracic:               Appears normal         Spine:                  Previously seen
                         Previously seen
 Heart:                  Appears normal         Upper Extremities:      Previously seen
                         (4CH, axis, and situs)
 RVOT:                   Appears normal         Lower Extremities:      Previously seen
 LVOT:                   Appears normal

 Other:   Male gender. Heels,  5th digits previously visualized. Nasal bone
          visualized. Lenses visualized.
Cervix Uterus Adnexa

 Cervix
 Not visualized (advanced GA >46wks)
Impression

 Normal interval growth.
 Chronic hypertension.
Recommendations

 Repeat fetal growth in 4 weeks.

## 2019-06-23 IMAGING — US US MFM FETAL BPP WO NON STRESS
1 series · 15 of 26 positions shown · non-contrast
Comparison: none

[Series 1: us mfm fetal bpp wo non stress · 26 acquisitions, 15 frames shown]
[im 1/26]
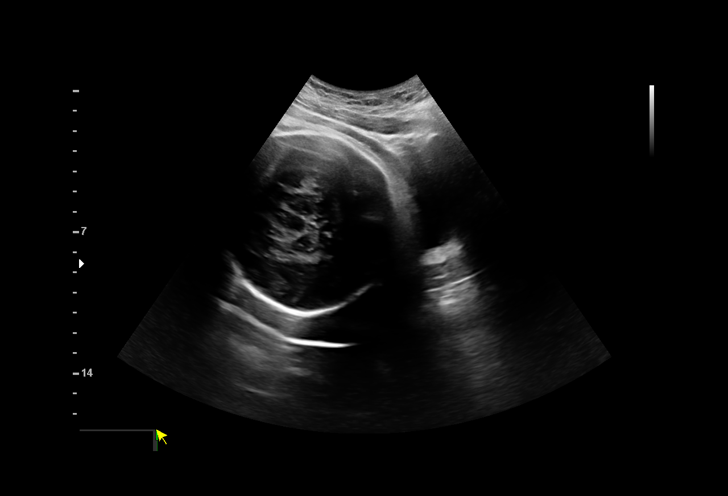
[im 3/26]
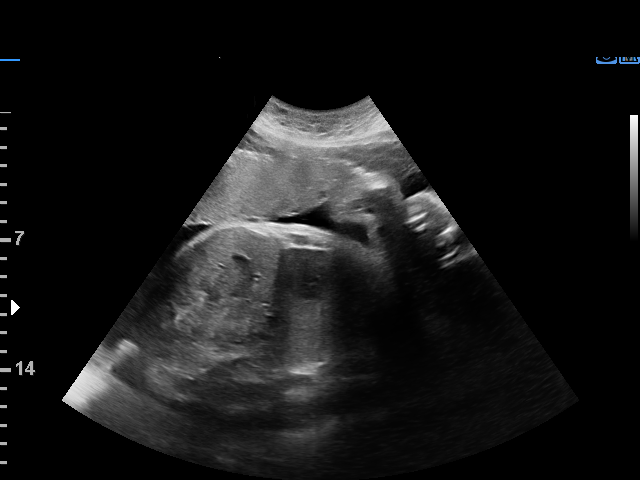
[im 5/26]
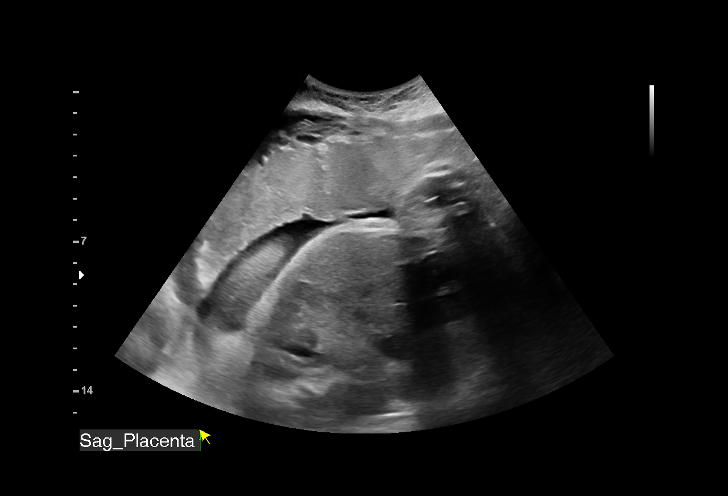
[im 7/26]
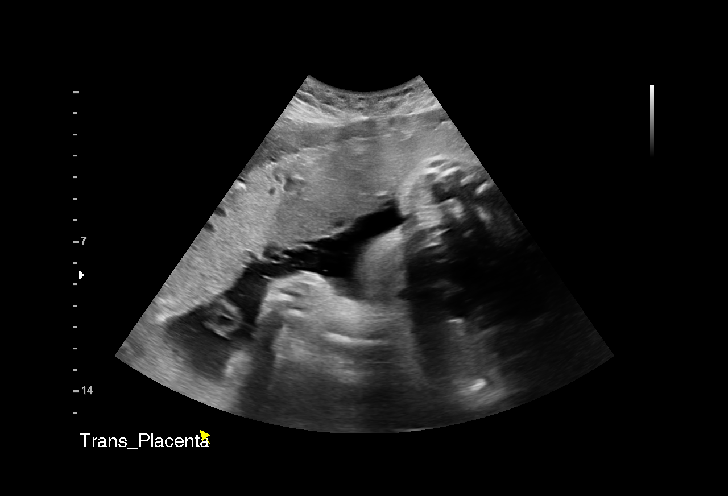
[im 8/26]
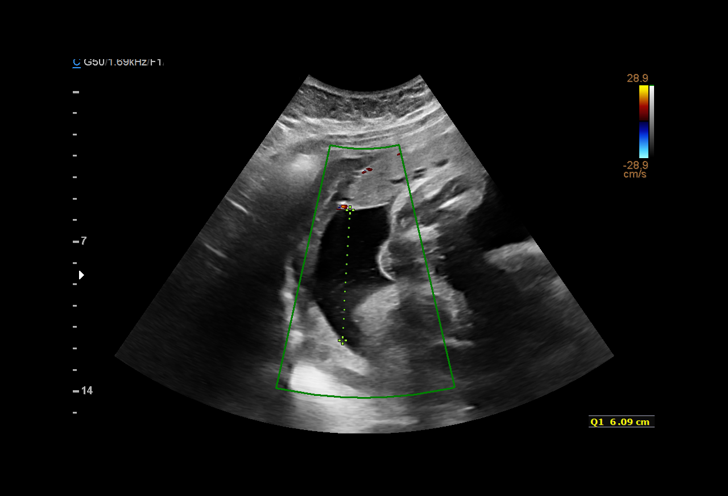
[im 10/26]
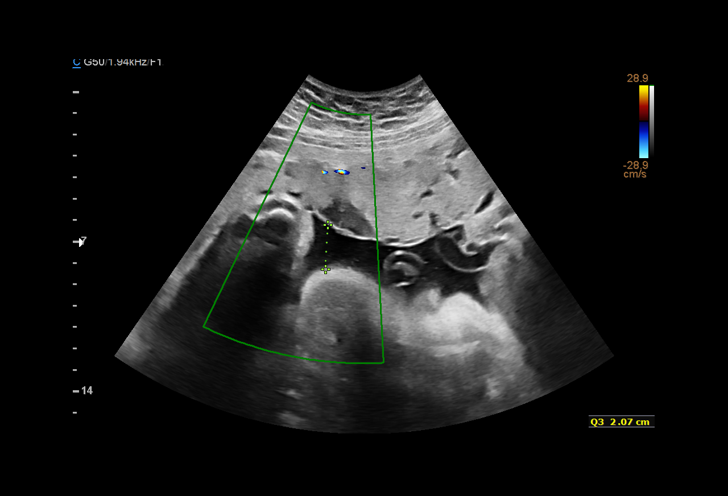
[im 12/26]
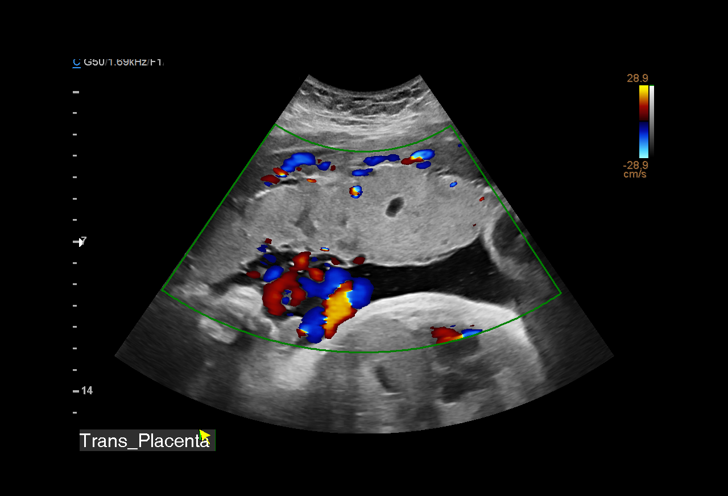
[im 14/26]
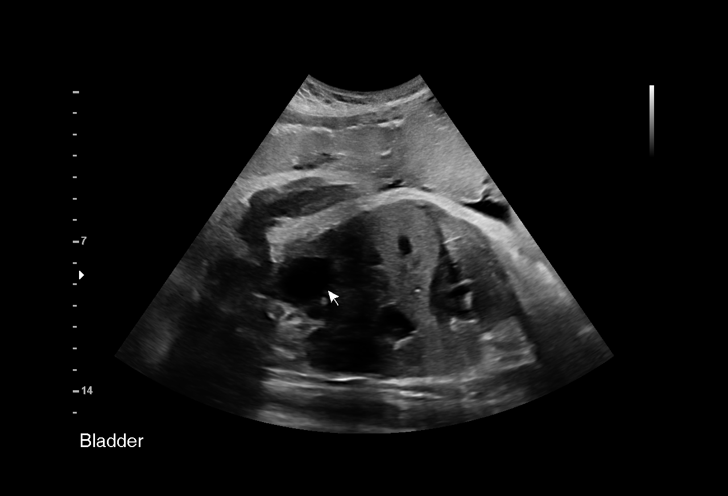
[im 15/26]
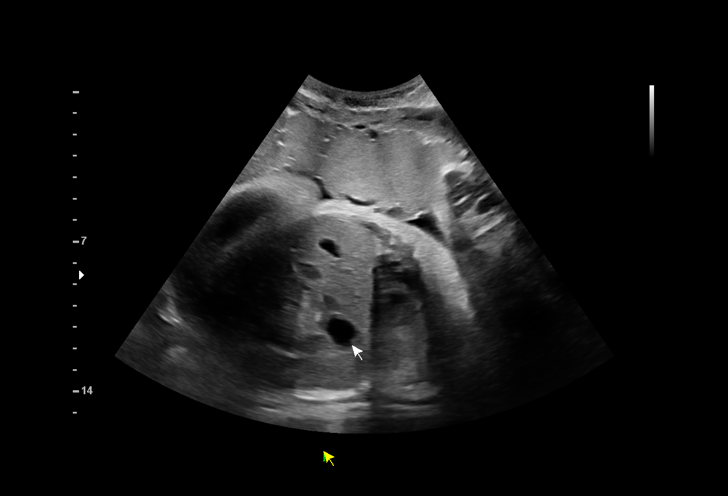
[im 17/26]
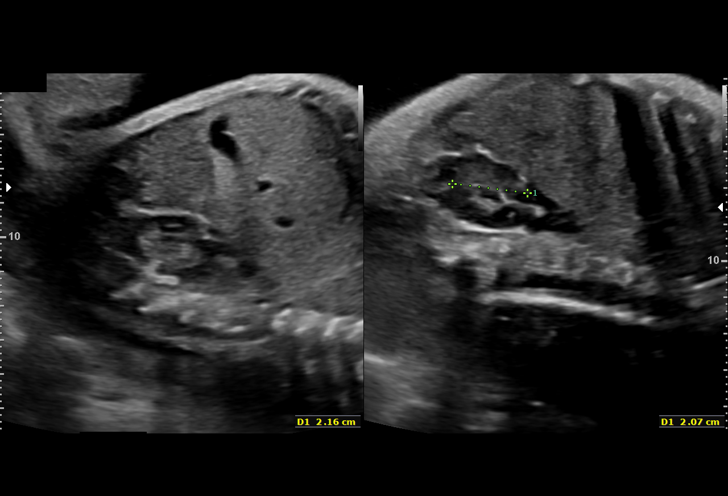
[im 19/26]
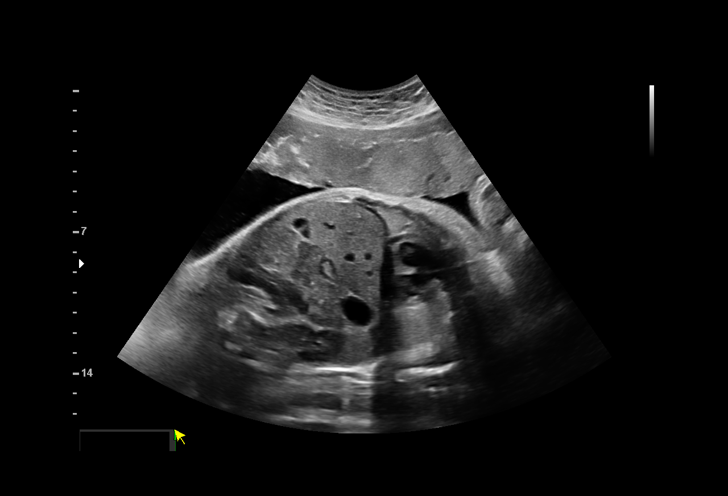
[im 20/26]
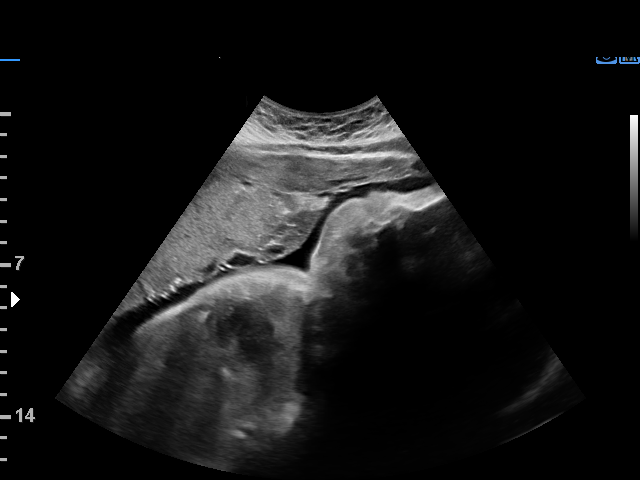
[im 22/26]
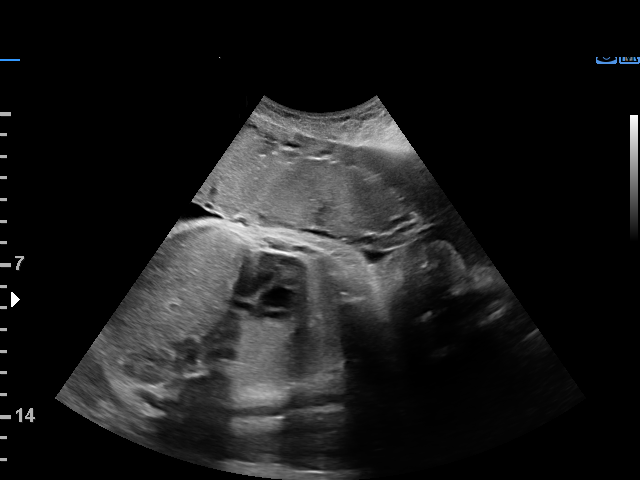
[im 24/26]
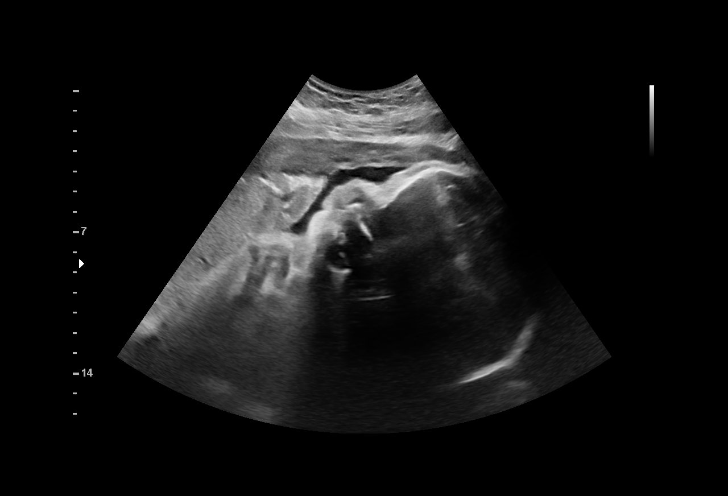
[im 26/26]
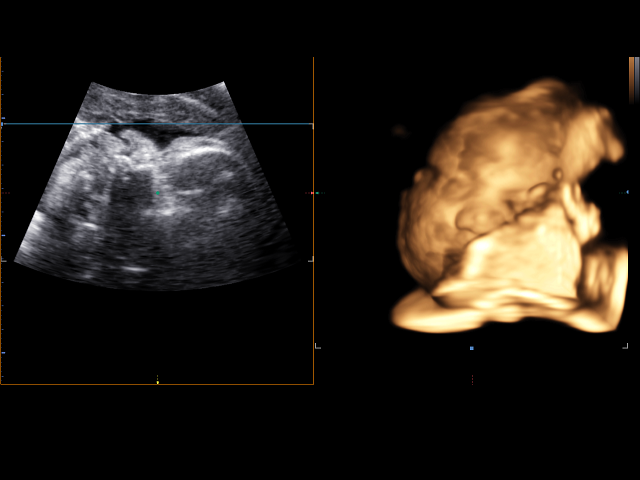

[15 of 26 positions shown; findings below may reference images not displayed]

MYTHILI
                   CNM

     STRESS                                            MATURANTI
 ----------------------------------------------------------------------

 ----------------------------------------------------------------------
Indications

  Hypertension - Chronic/Pre-existing (ASA)
  Obesity complicating pregnancy, third
  trimester
  36 weeks gestation of pregnancy
 ----------------------------------------------------------------------
Vital Signs

 BMI:
Fetal Evaluation

 Num Of Fetuses:         1
 Fetal Heart Rate(bpm):  141
 Cardiac Activity:       Observed
 Presentation:           Cephalic
 Placenta:               Anterior
 P. Cord Insertion:      Previously Visualized

 Amniotic Fluid
 AFI FV:      Within normal limits

 AFI Sum(cm)     %Tile       Largest Pocket(cm)
 15.71           58

 RUQ(cm)       RLQ(cm)       LUQ(cm)        LLQ(cm)

Biophysical Evaluation
 Amniotic F.V:   Within normal limits       F. Tone:        Observed
 F. Movement:    Observed                   Score:          [DATE]
 F. Breathing:   Observed
OB History

 Gravidity:    1
Gestational Age

 LMP:           36w 3d        Date:  04/07/18                 EDD:   01/12/19
 Best:          36w 3d     Det. By:  LMP  (04/07/18)          EDD:   01/12/19
Anatomy

 Thoracic:              Appears normal         Kidneys:                Appear normal
 Diaphragm:             Appears normal         Bladder:                Appears normal
 Stomach:               Appears normal, left
                        sided
Cervix Uterus Adnexa

 Cervix
 Not visualized (advanced GA >20wks)
Impression

 Chronic hypertension. Well-controlled without
 antihypertensives.
 Amniotic fluid is normal and good fetal activity is seen.
 Antenatal testing is reassuring. BPP [DATE].
Recommendations

 BPP in 2 weeks.
                 [place], Hp

## 2019-07-29 ENCOUNTER — Other Ambulatory Visit: Payer: Self-pay | Admitting: Obstetrics and Gynecology

## 2019-07-29 DIAGNOSIS — I10 Essential (primary) hypertension: Secondary | ICD-10-CM

## 2019-12-08 ENCOUNTER — Ambulatory Visit: Payer: Medicaid Other | Attending: Internal Medicine

## 2019-12-08 DIAGNOSIS — Z20822 Contact with and (suspected) exposure to covid-19: Secondary | ICD-10-CM

## 2019-12-09 LAB — SARS-COV-2, NAA 2 DAY TAT

## 2019-12-09 LAB — NOVEL CORONAVIRUS, NAA: SARS-CoV-2, NAA: NOT DETECTED

## 2019-12-10 ENCOUNTER — Telehealth: Payer: Self-pay | Admitting: General Practice

## 2019-12-10 NOTE — Telephone Encounter (Signed)
Negative COVID results given. Patient results "NOT Detected." Caller expressed understanding. ° °

## 2019-12-14 ENCOUNTER — Encounter: Payer: Self-pay | Admitting: General Practice

## 2020-03-17 ENCOUNTER — Other Ambulatory Visit: Payer: Self-pay

## 2020-03-17 ENCOUNTER — Ambulatory Visit (HOSPITAL_COMMUNITY)
Admission: EM | Admit: 2020-03-17 | Discharge: 2020-03-17 | Disposition: A | Discharge status: Home / Self Care | Payer: Medicaid Other | Attending: Physician Assistant | Admitting: Physician Assistant

## 2020-03-17 ENCOUNTER — Encounter (HOSPITAL_COMMUNITY): Payer: Self-pay

## 2020-03-17 DIAGNOSIS — Z20822 Contact with and (suspected) exposure to covid-19: Secondary | ICD-10-CM | POA: Diagnosis not present

## 2020-03-17 DIAGNOSIS — B349 Viral infection, unspecified: Secondary | ICD-10-CM | POA: Diagnosis not present

## 2020-03-17 DIAGNOSIS — J45909 Unspecified asthma, uncomplicated: Secondary | ICD-10-CM | POA: Insufficient documentation

## 2020-03-17 DIAGNOSIS — R1111 Vomiting without nausea: Secondary | ICD-10-CM | POA: Insufficient documentation

## 2020-03-17 DIAGNOSIS — I10 Essential (primary) hypertension: Secondary | ICD-10-CM | POA: Diagnosis not present

## 2020-03-17 DIAGNOSIS — R52 Pain, unspecified: Secondary | ICD-10-CM | POA: Diagnosis present

## 2020-03-17 DIAGNOSIS — Z79899 Other long term (current) drug therapy: Secondary | ICD-10-CM | POA: Diagnosis not present

## 2020-03-17 DIAGNOSIS — Z791 Long term (current) use of non-steroidal anti-inflammatories (NSAID): Secondary | ICD-10-CM | POA: Diagnosis not present

## 2020-03-17 LAB — SARS CORONAVIRUS 2 (TAT 6-24 HRS): SARS Coronavirus 2: NEGATIVE

## 2020-03-17 MED ORDER — FLUTICASONE PROPIONATE 50 MCG/ACT NA SUSP: 1.0000 | Freq: Every day | NASAL | 0 refills | Status: DC

## 2020-03-17 MED ORDER — ONDANSETRON HCL 4 MG PO TABS: 4.0000 mg | ORAL_TABLET | Freq: Four times a day (QID) | ORAL | 0 refills | Status: DC

## 2020-03-17 NOTE — Discharge Instructions (Addendum)
Take the zofran every 8 hours as needed for nausea and vomiting Take tylenol as needed for sore throat and bodyache Flonase daily for nasal congestion  If you continue to vomit despite treatment, have blood in vomit or stool, chest pain, severe belly pain, go to the Emergency Department  Schedule follow up with your PCP   If your Covid-19 test is positive, you will receive a phone call from [facility] regarding your results. Negative test results are not called. Both positive and negative results area always visible on MyChart. If you do not have a MyChart account, sign up instructions are in your discharge papers.   Persons who are directed to care for themselves at home may discontinue isolation under the following conditions:   At least 10 days have passed since symptom onset and  At least 24 hours have passed without running a fever (this means without the use of fever-reducing medications) and  Other symptoms have improved.  Persons infected with COVID-19 who never develop symptoms may discontinue isolation and other precautions 10 days after the date of their first positive COVID-19 test.

## 2020-03-17 NOTE — ED Provider Notes (Signed)
[place]    CSN: [id number] Arrival date & time: 03/17/20  0931      History   Chief Complaint Chief Complaint  Patient presents with  . Chills  . Generalized Body Aches  . Emesis    HPI Madison Allen is a 28 y.o. female.   Patient reports for evaluation of body ache, chills and vomiting.  She reports this started this morning around 3 AM.  She is also endorsing nasal congestion, mild sore throat and a slight cough.  She is also had a slight headache.  She has vomited a few times.  Is been no blood.  Denies diarrhea.  Denies significant abdominal pain.  She has not taken her temperature at home.  She has been able to keep down water, but has not been able to eat food.  Denies painful urination, frequency or urgency.     Past Medical History:  Diagnosis Date  . Asthma   . Hypertension     Patient Active Problem List   Diagnosis Date Noted  . Chronic hypertension affecting pregnancy 07/17/2018  . Supervision of high risk pregnancy, antepartum 07/06/2018    Past Surgical History:  Procedure Laterality Date  . NO PAST SURGERIES      OB History    Gravida  1   Para  1   Term  1   Preterm      AB      Living  1     SAB      TAB      Ectopic      Multiple  0   Live Births  1            Home Medications    Prior to Admission medications   Medication Sig Start Date End Date Taking? Authorizing Provider  acetaminophen (TYLENOL) 325 MG tablet Take 2 tablets (650 mg total) by mouth every 4 (four) hours as needed (for pain scale < 4). 01/15/19   Glenice Bow, DO  fluticasone (FLONASE) 50 MCG/ACT nasal spray Place 1 spray into both nostrils daily. 03/17/20   Adrie Picking, Marguerita Beards, PA-C  ibuprofen (ADVIL) 800 MG tablet Take 1 tablet (800 mg total) by mouth 3 (three) times daily. 01/15/19   Glenice Bow, DO  NIFEdipine (PROCARDIA XL) 30 MG 24 hr tablet Take 1 tablet (30 mg total) by mouth daily. 03/08/19   Kerin Perna, NP    norethindrone (MICRONOR) 0.35 MG tablet TAKE 1 TABLET BY MOUTH EVERY DAY 03/30/19   Laury Deep, CNM  ondansetron (ZOFRAN) 4 MG tablet Take 1 tablet (4 mg total) by mouth every 6 (six) hours. 03/17/20   Caitlinn Klinker, Marguerita Beards, PA-C  Prenatal Vit-Fe Fumarate-FA (PRENATAL COMPLETE) 14-0.4 MG TABS Take 1 tablet by mouth daily. 06/08/18   Zigmund Gottron, NP  senna-docusate (SENOKOT-S) 8.6-50 MG tablet Take 2 tablets by mouth at bedtime as needed for mild constipation. 01/15/19   Glenice Bow, DO    Family History Family History  Problem Relation Age of Onset  . Diabetes Mother     Social History Social History   Tobacco Use  . Smoking status: Never Smoker  . Smokeless tobacco: Never Used  Vaping Use  . Vaping Use: Never used  Substance Use Topics  . Alcohol use: Not Currently  . Drug use: Never     Allergies   Patient has no known allergies.   Review of Systems Review of Systems   Physical Exam Triage Vital  Signs ED Triage Vitals  Enc Vitals Group     BP 03/17/20 1040 134/84     Pulse Rate 03/17/20 1040 88     Resp 03/17/20 1040 20     Temp 03/17/20 1040 98.7 F (37.1 C)     Temp Source 03/17/20 1040 Oral     SpO2 03/17/20 1040 97 %     Weight --      Height --      Head Circumference --      Peak Flow --      Pain Score 03/17/20 1041 0     Pain Loc --      Pain Edu? --      Excl. in [place]? --    No data found.  Updated Vital Signs BP 134/84 (BP Location: Right Arm)   Pulse 88   Temp 98.7 F (37.1 C) (Oral)   Resp 20   LMP  (Within Weeks) Comment: 1 week  SpO2 97%   Visual Acuity Right Eye Distance:   Left Eye Distance:   Bilateral Distance:    Right Eye Near:   Left Eye Near:    Bilateral Near:     Physical Exam Vitals and nursing note reviewed.  Constitutional:      General: She is not in acute distress.    Appearance: She is well-developed. She is not ill-appearing.  HENT:     Head: Normocephalic and atraumatic.     Nose: Congestion and  rhinorrhea present.     Mouth/Throat:     Mouth: Mucous membranes are moist.     Pharynx: Oropharynx is clear. No oropharyngeal exudate or posterior oropharyngeal erythema.     Comments: Mucous membranes moist Eyes:     Extraocular Movements: Extraocular movements intact.     Conjunctiva/sclera: Conjunctivae normal.     Pupils: Pupils are equal, round, and reactive to light.  Cardiovascular:     Rate and Rhythm: Normal rate and regular rhythm.     Heart sounds: No murmur heard.   Pulmonary:     Effort: Pulmonary effort is normal. No respiratory distress.     Breath sounds: Normal breath sounds.  Abdominal:     Palpations: Abdomen is soft.     Tenderness: There is no abdominal tenderness. There is no right CVA tenderness or left CVA tenderness.  Musculoskeletal:     Cervical back: Neck supple.     Right lower leg: No edema.     Left lower leg: No edema.  Lymphadenopathy:     Cervical: No cervical adenopathy.  Skin:    General: Skin is warm and dry.  Neurological:     General: No focal deficit present.     Mental Status: She is alert and oriented to person, place, and time.      UC Treatments / Results  Labs (all labs ordered are listed, but only abnormal results are displayed) Labs Reviewed  SARS CORONAVIRUS 2 (TAT 6-24 HRS)    EKG   Radiology No results found.  Procedures Procedures (including critical care time)  Medications Ordered in UC Medications - No data to display  Initial Impression / Assessment and Plan / UC Course  I have reviewed the triage vital signs and the nursing notes.  Pertinent labs & imaging results that were available during my care of the patient were reviewed by me and considered in my medical decision making (see chart for details).     #Viral illness Patient is 29 year old presenting with viral  illness.  Respiratory and GI symptoms, concern for Covid.  PCR sent.  We will treat her symptomatically with Zofran as needed for nausea  and vomiting, Tylenol for sore throat and body ache.  Flonase for congestion.  Encourage p.o. liquid intake and small meals.  Strict return and emergency department precautions were discussed.  Instructed to follow-up with primary care if not improving or return to this clinic.  Patient verbalized agreement understanding the plan. Final Clinical Impressions(s) / UC Diagnoses   Final diagnoses:  Viral illness     Discharge Instructions     Take the zofran every 8 hours as needed for nausea and vomiting Take tylenol as needed for sore throat and bodyache Flonase daily for nasal congestion  If you continue to vomit despite treatment, have blood in vomit or stool, chest pain, severe belly pain, go to the Emergency Department  Schedule follow up with your PCP   If your Covid-19 test is positive, you will receive a phone call from [facility] regarding your results. Negative test results are not called. Both positive and negative results area always visible on MyChart. If you do not have a MyChart account, sign up instructions are in your discharge papers.   Persons who are directed to care for themselves at home may discontinue isolation under the following conditions:  . At least 10 days have passed since symptom onset and . At least 24 hours have passed without running a fever (this means without the use of fever-reducing medications) and . Other symptoms have improved.  Persons infected with COVID-19 who never develop symptoms may discontinue isolation and other precautions 10 days after the date of their first positive COVID-19 test.       ED Prescriptions    Medication Sig Dispense Auth. Provider   ondansetron (ZOFRAN) 4 MG tablet Take 1 tablet (4 mg total) by mouth every 6 (six) hours. 6 tablet Joice Nazario, Marguerita Beards, PA-C   fluticasone (FLONASE) 50 MCG/ACT nasal spray Place 1 spray into both nostrils daily. 11.1 mL Deleon Passe, Marguerita Beards, PA-C     PDMP not reviewed this encounter.   Purnell Shoemaker, PA-C 03/17/20 2105

## 2020-03-17 NOTE — ED Triage Notes (Signed)
Pt presents with chills, body aches and vomiting since 3  am today.

## 2020-04-18 ENCOUNTER — Other Ambulatory Visit: Payer: Medicaid Other

## 2020-04-19 ENCOUNTER — Other Ambulatory Visit: Payer: Medicaid Other

## 2020-04-19 ENCOUNTER — Other Ambulatory Visit: Payer: Self-pay | Admitting: Critical Care Medicine

## 2020-04-19 DIAGNOSIS — Z20822 Contact with and (suspected) exposure to covid-19: Secondary | ICD-10-CM

## 2020-04-21 LAB — NOVEL CORONAVIRUS, NAA: SARS-CoV-2, NAA: NOT DETECTED

## 2020-04-21 LAB — SARS-COV-2, NAA 2 DAY TAT

## 2021-11-01 ENCOUNTER — Other Ambulatory Visit: Payer: Self-pay

## 2021-11-01 ENCOUNTER — Ambulatory Visit (INDEPENDENT_AMBULATORY_CARE_PROVIDER_SITE_OTHER): Payer: Medicaid Other

## 2021-11-01 ENCOUNTER — Ambulatory Visit (HOSPITAL_COMMUNITY)
Admission: EM | Admit: 2021-11-01 | Discharge: 2021-11-01 | Disposition: A | Discharge status: Home / Self Care | Payer: Medicaid Other | Attending: Nurse Practitioner | Admitting: Nurse Practitioner

## 2021-11-01 ENCOUNTER — Encounter (HOSPITAL_COMMUNITY): Payer: Self-pay

## 2021-11-01 DIAGNOSIS — R509 Fever, unspecified: Secondary | ICD-10-CM | POA: Diagnosis not present

## 2021-11-01 DIAGNOSIS — R519 Headache, unspecified: Secondary | ICD-10-CM | POA: Insufficient documentation

## 2021-11-01 DIAGNOSIS — J069 Acute upper respiratory infection, unspecified: Secondary | ICD-10-CM | POA: Diagnosis not present

## 2021-11-01 DIAGNOSIS — R062 Wheezing: Secondary | ICD-10-CM

## 2021-11-01 DIAGNOSIS — R5383 Other fatigue: Secondary | ICD-10-CM

## 2021-11-01 DIAGNOSIS — R059 Cough, unspecified: Secondary | ICD-10-CM | POA: Insufficient documentation

## 2021-11-01 DIAGNOSIS — Z20822 Contact with and (suspected) exposure to covid-19: Secondary | ICD-10-CM | POA: Insufficient documentation

## 2021-11-01 DIAGNOSIS — J4521 Mild intermittent asthma with (acute) exacerbation: Secondary | ICD-10-CM | POA: Insufficient documentation

## 2021-11-01 LAB — POC INFLUENZA A AND B ANTIGEN (URGENT CARE ONLY)
INFLUENZA A ANTIGEN, POC: NEGATIVE
INFLUENZA B ANTIGEN, POC: NEGATIVE

## 2021-11-01 LAB — POC URINE PREG, ED: Preg Test, Ur: NEGATIVE

## 2021-11-01 MED ORDER — ALBUTEROL SULFATE HFA 108 (90 BASE) MCG/ACT IN AERS: 1.0000 | INHALATION_SPRAY | Freq: Four times a day (QID) | RESPIRATORY_TRACT | 0 refills | Status: DC | PRN

## 2021-11-01 MED ORDER — ALBUTEROL SULFATE (2.5 MG/3ML) 0.083% IN NEBU
INHALATION_SOLUTION | RESPIRATORY_TRACT | Status: AC
  Filled 2021-11-01: qty 3

## 2021-11-01 MED ORDER — ALBUTEROL SULFATE (2.5 MG/3ML) 0.083% IN NEBU
2.5000 mg | INHALATION_SOLUTION | Freq: Once | RESPIRATORY_TRACT | Status: AC
  Administered 2021-11-01: 2.5 mg via RESPIRATORY_TRACT

## 2021-11-01 MED ORDER — ACETAMINOPHEN 325 MG PO TABS
ORAL_TABLET | ORAL | Status: AC
  Filled 2021-11-01: qty 2

## 2021-11-01 MED ORDER — ACETAMINOPHEN 325 MG PO TABS
650.0000 mg | ORAL_TABLET | Freq: Once | ORAL | Status: AC
Start: 2021-11-01 — End: 2021-11-01
  Administered 2021-11-01: 650 mg via ORAL

## 2021-11-01 MED ORDER — PREDNISONE 20 MG PO TABS: 40.0000 mg | ORAL_TABLET | Freq: Every day | ORAL | 0 refills | Status: AC

## 2021-11-01 NOTE — ED Triage Notes (Signed)
Pt c/o cough x1wk. States today having a headache and chest pain from coughing. States took cough syrup and ibuprofen this am.  ?

## 2021-11-01 NOTE — Discharge Instructions (Addendum)
Chest x-ray does not show pneumonia today and flu test is negative.  We will let you know if COVID test result is positive.  Your symptoms are likely viral and should improve over the next week.  Please use albuterol inhaler every 4-6 hours as needed for wheezing or shortness of breath.  Start prednisone tomorrow morning to help with inflammation. ?Some things that can make you feel better are: ?- Increased rest ?- Increasing fluid with water/sugar free electrolytes ?- Acetaminophen as needed for fever/pain.  ?- Salt water gargling, chloraseptic spray and throat lozenges ?- OTC guaifenesin (Mucinex).  ?- Saline sinus flushes or a neti pot.  ?- Humidifying the air. ?Please return if your symptoms are not improving over the next couple of days.  If they worsen, please go to the Emergency Room. ?

## 2021-11-01 NOTE — ED Provider Notes (Signed)
[place]    CSN: [id number] Arrival date & time: 11/01/21  1025      History   Chief Complaint Chief Complaint  Patient presents with   Cough    HPI Madison Allen is a 31 y.o. female.   Patient reports 1 week history of cough.  She reports yesterday, her symptoms worsen.  She started with fevers, body aches, chills.  She also started wheezing, and having chest tightness with coughing.  She reports chest congestion, fatigue, and headache.  She denies runny nose, postnasal drip, sore throat, swollen glands.  Denies ear pain or pressure, eye redness, nausea, vomiting, diarrhea, and change in appetite.  She denies any new rash on her skin.  She has tried ibuprofen for the headache without relief of symptoms.  She reports her employer sent her home from work today due to weakness and headache.  She reports her periods are irregular, last menstrual period was in January.  She also reports a history of asthma, has not had an inhaler in years.   Past Medical History:  Diagnosis Date   Asthma    Hypertension     Patient Active Problem List   Diagnosis Date Noted   Chronic hypertension affecting pregnancy 07/17/2018   Supervision of high risk pregnancy, antepartum 07/06/2018    Past Surgical History:  Procedure Laterality Date   NO PAST SURGERIES      OB History     Gravida  1   Para  1   Term  1   Preterm      AB      Living  1      SAB      IAB      Ectopic      Multiple  0   Live Births  1            Home Medications    Prior to Admission medications   Medication Sig Start Date End Date Taking? Authorizing Provider  acetaminophen (TYLENOL) 325 MG tablet Take 2 tablets (650 mg total) by mouth every 4 (four) hours as needed (for pain scale < 4). 01/15/19   Glenice Bow, DO  fluticasone (FLONASE) 50 MCG/ACT nasal spray Place 1 spray into both nostrils daily. 03/17/20   Darr, Edison Nasuti, PA-C  ibuprofen (ADVIL) 800 MG tablet Take 1  tablet (800 mg total) by mouth 3 (three) times daily. 01/15/19   Glenice Bow, DO  ondansetron (ZOFRAN) 4 MG tablet Take 1 tablet (4 mg total) by mouth every 6 (six) hours. 03/17/20   Darr, Edison Nasuti, PA-C  senna-docusate (SENOKOT-S) 8.6-50 MG tablet Take 2 tablets by mouth at bedtime as needed for mild constipation. 01/15/19   Glenice Bow, DO    Family History Family History  Problem Relation Age of Onset   Diabetes Mother     Social History Social History   Tobacco Use   Smoking status: Never   Smokeless tobacco: Never  Vaping Use   Vaping Use: Never used  Substance Use Topics   Alcohol use: Not Currently   Drug use: Never     Allergies   Patient has no known allergies.   Review of Systems Review of Systems Per HPI  Physical Exam Triage Vital Signs ED Triage Vitals  Enc Vitals Group     BP 11/01/21 1232 (!) 155/99     Pulse Rate 11/01/21 1232 90     Resp 11/01/21 1232 18     Temp 11/01/21 1232  99.6 F (37.6 C)     Temp Source 11/01/21 1232 Oral     SpO2 11/01/21 1232 100 %     Weight --      Height --      Head Circumference --      Peak Flow --      Pain Score 11/01/21 1233 0     Pain Loc --      Pain Edu? --      Excl. in Lupus? --    No data found.  Updated Vital Signs BP (!) 155/99 (BP Location: Right Arm)    Pulse 90    Temp 99.6 F (37.6 C) (Oral)    Resp 18    LMP 09/26/2021    SpO2 100%    Breastfeeding No   Visual Acuity Right Eye Distance:   Left Eye Distance:   Bilateral Distance:    Right Eye Near:   Left Eye Near:    Bilateral Near:     Physical Exam Vitals and nursing note reviewed.  Constitutional:      General: She is not in acute distress.    Appearance: Normal appearance. She is ill-appearing. She is not toxic-appearing.  HENT:     Head: Normocephalic and atraumatic.     Right Ear: There is impacted cerumen.     Left Ear: There is impacted cerumen.     Nose: Nose normal. No congestion or rhinorrhea.     Mouth/Throat:      Mouth: Mucous membranes are moist.     Pharynx: Oropharynx is clear. Posterior oropharyngeal erythema present.  Eyes:     General: No scleral icterus.    Extraocular Movements: Extraocular movements intact.  Cardiovascular:     Rate and Rhythm: Normal rate and regular rhythm.  Pulmonary:     Effort: Pulmonary effort is normal. No respiratory distress.     Breath sounds: Wheezing present. No rhonchi or rales.  Musculoskeletal:     Cervical back: Normal range of motion.  Lymphadenopathy:     Cervical: No cervical adenopathy.  Skin:    General: Skin is warm and dry.     Capillary Refill: Capillary refill takes less than 2 seconds.     Coloration: Skin is not jaundiced or pale.     Findings: No erythema.  Neurological:     Mental Status: She is alert and oriented to person, place, and time.  Psychiatric:        Mood and Affect: Mood normal.        Behavior: Behavior normal.        Thought Content: Thought content normal.        Judgment: Judgment normal.     UC Treatments / Results  Labs (all labs ordered are listed, but only abnormal results are displayed) Labs Reviewed  SARS CORONAVIRUS 2 (TAT 6-24 HRS)  POC INFLUENZA A AND B ANTIGEN (URGENT CARE ONLY)  POC URINE PREG, ED    EKG   Radiology DG Chest 2 View  Result Date: 11/01/2021 CLINICAL DATA:  Cough over the last week. Fever. Fatigue. Wheezing. EXAM: CHEST - 2 VIEW COMPARISON:  None. FINDINGS: Heart and mediastinal shadows are normal. There is central bronchial thickening but no infiltrate, collapse or effusion. Bony structures are unremarkable. IMPRESSION: Possible bronchitis.  No consolidation or collapse. Electronically Signed   By: Nelson Chimes M.D.   On: 11/01/2021 13:55    Procedures Procedures (including critical care time)  Medications Ordered in UC Medications  acetaminophen (  TYLENOL) tablet 650 mg (has no administration in time range)  albuterol (PROVENTIL) (2.5 MG/3ML) 0.083% nebulizer solution 2.5  mg (2.5 mg Nebulization Given 11/01/21 1319)    Initial Impression / Assessment and Plan / UC Course  I have reviewed the triage vital signs and the nursing notes.  Pertinent labs & imaging results that were available during my care of the patient were reviewed by me and considered in my medical decision making (see chart for details).      Upon recheck after albuterol treatment, lung sounds are clear.  Her chest x-ray does not show an acute pneumonia.  Flu test is negative.  COVID test is pending.  Suspect acute viral upper respiratory illness as cause of symptoms.  Given history of asthma and wheezing, will give albuterol inhaler every 4-6 hours as needed for wheezing or shortness of breath and start prednisone 40 mg for 5 days for inflammation in her airway.  Discussed supportive care including increasing intake of water or sugar free electrolyte solution, guaifenesin for congestion, warm water with lemon and honey for throat, humidifier and steam showers for congestion.  If symptoms persist despite treatment, seek care.  If symptoms worsen, go to ER.  With any sudden onset new chest pain, dizziness, sweating, or shortness of breath, go to ED.  Note given for work.     Final Clinical Impressions(s) / UC Diagnoses   Final diagnoses:  Viral URI with cough  Mild intermittent asthma with exacerbation  Acute nonintractable headache, unspecified headache type     Discharge Instructions      Chest x-ray does not show pneumonia today and flu test is negative.  We will let you know if COVID test result is positive.  Your symptoms are likely viral and should improve over the next week.  Please use albuterol inhaler every 4-6 hours as needed for wheezing or shortness of breath.  Start prednisone tomorrow morning to help with inflammation. Some things that can make you feel better are: - Increased rest - Increasing fluid with water/sugar free electrolytes - Acetaminophen as needed for fever/pain.   - Salt water gargling, chloraseptic spray and throat lozenges - OTC guaifenesin (Mucinex).  - Saline sinus flushes or a neti pot.  - Humidifying the air. Please return if your symptoms are not improving over the next couple of days.  If they worsen, please go to the Emergency Room.     ED Prescriptions   None    PDMP not reviewed this encounter.   Eulogio Bear, NP 11/01/21 1410

## 2021-11-02 LAB — SARS CORONAVIRUS 2 (TAT 6-24 HRS): SARS Coronavirus 2: NEGATIVE

## 2022-03-18 ENCOUNTER — Encounter (HOSPITAL_BASED_OUTPATIENT_CLINIC_OR_DEPARTMENT_OTHER): Payer: Self-pay

## 2022-03-18 ENCOUNTER — Other Ambulatory Visit: Payer: Self-pay

## 2022-03-18 ENCOUNTER — Emergency Department (HOSPITAL_BASED_OUTPATIENT_CLINIC_OR_DEPARTMENT_OTHER)
Admission: EM | Admit: 2022-03-18 | Discharge: 2022-03-18 | Disposition: A | Discharge status: Home / Self Care | Payer: Medicaid Other | Attending: Emergency Medicine | Admitting: Emergency Medicine

## 2022-03-18 DIAGNOSIS — O219 Vomiting of pregnancy, unspecified: Secondary | ICD-10-CM | POA: Diagnosis present

## 2022-03-18 DIAGNOSIS — Z3A00 Weeks of gestation of pregnancy not specified: Secondary | ICD-10-CM | POA: Diagnosis not present

## 2022-03-18 DIAGNOSIS — Z3490 Encounter for supervision of normal pregnancy, unspecified, unspecified trimester: Secondary | ICD-10-CM

## 2022-03-18 LAB — PREGNANCY, URINE: Preg Test, Ur: POSITIVE — AB

## 2022-03-18 MED ORDER — ONDANSETRON 4 MG PO TBDP: 4.0000 mg | ORAL_TABLET | Freq: Three times a day (TID) | ORAL | 0 refills | Status: DC | PRN

## 2022-03-18 MED ORDER — ONDANSETRON 4 MG PO TBDP
4.0000 mg | ORAL_TABLET | Freq: Once | ORAL | Status: DC
  Filled 2022-03-18: qty 1

## 2022-03-18 NOTE — Discharge Instructions (Addendum)
Follow-up with your OBGYN for routine prenatal care. Zofran ODT has been prescribed for nausea and vomiting. If you develop vaginal bleeding or pelvic pain return to the ED for an ultrasound to evaluate further

## 2022-03-18 NOTE — ED Provider Notes (Signed)
  [place] EMERGENCY DEPT Provider Note   CSN: [id number] Arrival date & time: 03/18/22  1829     History {Add pertinent medical, surgical, social history, OB history to HPI:1} Chief Complaint  Patient presents with   Possible Pregnancy    Madison Allen is a 31 y.o. female.   Possible Pregnancy       Home Medications Prior to Admission medications   Medication Sig Start Date End Date Taking? Authorizing Provider  acetaminophen (TYLENOL) 325 MG tablet Take 2 tablets (650 mg total) by mouth every 4 (four) hours as needed (for pain scale < 4). 01/15/19   Glenice Bow, DO  albuterol (VENTOLIN HFA) 108 (90 Base) MCG/ACT inhaler Inhale 1-2 puffs into the lungs every 6 (six) hours as needed for wheezing or shortness of breath. 11/01/21   Eulogio Bear, NP  fluticasone (FLONASE) 50 MCG/ACT nasal spray Place 1 spray into both nostrils daily. 03/17/20   Darr, Edison Nasuti, PA-C  ibuprofen (ADVIL) 800 MG tablet Take 1 tablet (800 mg total) by mouth 3 (three) times daily. 01/15/19   Glenice Bow, DO  ondansetron (ZOFRAN) 4 MG tablet Take 1 tablet (4 mg total) by mouth every 6 (six) hours. 03/17/20   Darr, Edison Nasuti, PA-C  senna-docusate (SENOKOT-S) 8.6-50 MG tablet Take 2 tablets by mouth at bedtime as needed for mild constipation. 01/15/19   Glenice Bow, DO      Allergies    Patient has no known allergies.    Review of Systems   Review of Systems  Physical Exam Updated Vital Signs BP (!) 154/95 (BP Location: Right Arm)   Pulse 82   Temp (!) 97.5 F (36.4 C) (Temporal)   Resp 16   Ht '5\' 8"'$  (1.727 m)   Wt 100.5 kg   SpO2 98%   BMI 33.69 kg/m  Physical Exam  ED Results / Procedures / Treatments   Labs (all labs ordered are listed, but only abnormal results are displayed) Labs Reviewed  PREGNANCY, URINE - Abnormal; Notable for the following components:      Result Value   Preg Test, Ur POSITIVE (*)    All other components within normal limits     EKG None  Radiology No results found.  Procedures Procedures  {Document cardiac monitor, telemetry assessment procedure when appropriate:1}  Medications Ordered in ED Medications - No data to display  ED Course/ Medical Decision Making/ A&P                           Medical Decision Making Amount and/or Complexity of Data Reviewed Labs: ordered.   ***  {Document critical care time when appropriate:1} {Document review of labs and clinical decision tools ie heart score, Chads2Vasc2 etc:1}  {Document your independent review of radiology images, and any outside records:1} {Document your discussion with family members, caretakers, and with consultants:1} {Document social determinants of health affecting pt's care:1} {Document your decision making why or why not admission, treatments were needed:1} Final Clinical Impression(s) / ED Diagnoses Final diagnoses:  None    Rx / DC Orders ED Discharge Orders     None

## 2022-03-18 NOTE — ED Notes (Signed)
Reviewed AVS/discharge instruction with patient. Time allotted for and all questions answered. Patient is agreeable for d/c and escorted to ed exit by staff.  

## 2022-03-18 NOTE — ED Triage Notes (Signed)
Patient here POV from Home.  Endorses Possible Pregnancy. Seeks Confirmation. No Current Contraceptives.   LMP was 01/02/22.   No Symptoms otherwise besides Nausea and Emesis. No Urinary Symptoms. No ABD Pain. No Diarrhea. No Fevers.   NAD Noted during Triage. A&Ox4. GCS 15. Ambulatory.

## 2022-03-21 ENCOUNTER — Telehealth (INDEPENDENT_AMBULATORY_CARE_PROVIDER_SITE_OTHER): Payer: Medicaid Other

## 2022-03-21 DIAGNOSIS — Z3A00 Weeks of gestation of pregnancy not specified: Secondary | ICD-10-CM

## 2022-03-21 DIAGNOSIS — O0990 Supervision of high risk pregnancy, unspecified, unspecified trimester: Secondary | ICD-10-CM | POA: Insufficient documentation

## 2022-03-21 DIAGNOSIS — O10019 Pre-existing essential hypertension complicating pregnancy, unspecified trimester: Secondary | ICD-10-CM

## 2022-03-21 DIAGNOSIS — I10 Essential (primary) hypertension: Secondary | ICD-10-CM

## 2022-03-21 DIAGNOSIS — J45909 Unspecified asthma, uncomplicated: Secondary | ICD-10-CM

## 2022-03-21 DIAGNOSIS — O169 Unspecified maternal hypertension, unspecified trimester: Secondary | ICD-10-CM | POA: Insufficient documentation

## 2022-03-21 MED ORDER — GOJJI WEIGHT SCALE MISC: 1.0000 | 0 refills | Status: DC | PRN

## 2022-03-21 MED ORDER — BLOOD PRESSURE KIT DEVI: 1.0000 | 0 refills | Status: DC | PRN

## 2022-03-21 NOTE — Progress Notes (Signed)
New OB Intake  I connected with  Madison Allen on 03/21/22 at  3:15 PM EDT by MyChart Video Visit and verified that I am speaking with the correct person using two identifiers. Nurse is located at [facility] and pt is located at home.  I discussed the limitations, risks, security and privacy concerns of performing an evaluation and management service by telephone and the availability of in person appointments. I also discussed with the patient that there may be a patient responsible charge related to this service. The patient expressed understanding and agreed to proceed.  I explained I am completing New OB Intake today. We discussed her EDD of 10/30/2022 that is based on LMP of 01/03/2022. Pt is G2/P1. I reviewed her allergies, medications, Medical/Surgical/OB history, and appropriate screenings. I informed her of [facility] services. [facility] information placed in AVS. Based on history, this is a/an  pregnancy complicated by hypertension .   Patient Active Problem List   Diagnosis Date Noted   Hypertension 03/21/2022   Asthma 03/21/2022   Supervision of high risk pregnancy, antepartum 03/21/2022    Concerns addressed today: -Patient stated she has hypertension but currently not taking any medications.  Delivery Plans Plans to deliver at [facility]. Patient given information for [facility] Healthy Baby website for more information about Women's and [place]. Patient is not interested in water birth. Offered upcoming OB visit with CNM to discuss further.  MyChart/Babyscripts MyChart access verified. I explained pt will have some visits in office and some virtually. Babyscripts instructions given and order placed. Patient verifies receipt of registration text/e-mail. Account successfully created and app downloaded.  Blood Pressure Cuff/Weight Scale Blood pressure cuff ordered for patient to pick-up from [employer]. Explained after first prenatal appt pt will check weekly and document in 3.  Patient does not  have weight scale. Weight scale ordered for patient to pick up from [employer].   Anatomy US Explained first scheduled [place] will be around 19 weeks. Anatomy US scheduled for 06/07/2022 at 9:45AM. Pt notified to arrive at 9:30AM.  Labs Discussed Natera genetic screening with patient. Would like both Panorama and Horizon drawn at new OB visit. Routine prenatal labs needed.  Covid Vaccine Patient has not covid vaccine.   Is patient a CenteringPregnancy candidate?  Declined Declined due to Schedule/Times   Is patient a Mom+Baby Combined Care candidate?  Not a candidate     Social Determinants of Health Food Insecurity: Patient expresses food insecurity. Food Market information given to patient; explained patient may visit at the end of first OB appointment. WIC Referral: [facility]  Transportation: Patient denies transportation needs. Childcare: Discussed no children allowed at ultrasound appointments. Offered childcare services; patient declines childcare services at this time.  First visit review I reviewed new OB appt with pt. I explained she will have a provider visit that includes pap smear, initial OB labs, and pap smear.  Explained pt will be seen by Manya Silvas CNM at first visit; encounter routed to appropriate provider. Explained that patient will be seen by pregnancy navigator following visit with provider.   Mariane Baumgarten, [place] 03/21/2022  3:56 PM

## 2022-03-25 ENCOUNTER — Ambulatory Visit: Payer: Medicaid Other | Admitting: Family Medicine

## 2022-04-22 NOTE — Progress Notes (Unsigned)
INITIAL PRENATAL VISIT  Subjective:   Madison Allen is being seen today for her first obstetrical visit. This is not a planned pregnancy. This is a desired pregnancy.  She is 15.5 weeks by irregular. certain LMP. No US's this pregnancy.  Her obstetrical history is significant for  chronic hypertension not on meds . Relationship with FOB: spouse, living together. Patient does intend to breast feed. Pregnancy history fully reviewed.  Patient reports no bleeding, no contractions, no leaking, and vomiting about once per day. Zofran helps.     Obstetric History OB History  Gravida Para Term Preterm AB Living  _0 SAB IAB Ectopic Multiple Live Births        0 1    # Outcome Date GA Lbr Len/2nd Weight Sex Delivery Anes PTL Lv  2 Current           1 Term 01/13/19 [id number]:20 / 00:10 7 lb 9 oz (3.43 kg) M Vag-Spont Local  LIV    Past Medical History:  Diagnosis Date   Asthma    Hypertension     Past Surgical History:  Procedure Laterality Date   NO PAST SURGERIES      Current Outpatient Medications on File Prior to Visit  Medication Sig Dispense Refill   Blood Pressure Monitoring (BLOOD PRESSURE KIT) DEVI 1 Device by Does not apply route as needed. 1 each 0   Misc. Devices (GOJJI WEIGHT SCALE) MISC 1 Device by Does not apply route as needed. 1 each 0   ondansetron (ZOFRAN-ODT) 4 MG disintegrating tablet Take 1 tablet (4 mg total) by mouth every 8 (eight) hours as needed for nausea or vomiting. 20 tablet 0   acetaminophen (TYLENOL) 325 MG tablet Take 2 tablets (650 mg total) by mouth every 4 (four) hours as needed (for pain scale < 4). (Patient not taking: Reported on 04/23/2022)     albuterol (VENTOLIN HFA) 108 (90 Base) MCG/ACT inhaler Inhale 1-2 puffs into the lungs every 6 (six) hours as needed for wheezing or shortness of breath. (Patient not taking: Reported on 04/23/2022) 6.7 g 0   fluticasone (FLONASE) 50 MCG/ACT nasal spray Place 1 spray into both nostrils daily.  (Patient not taking: Reported on 03/21/2022) 11.1 mL 0   ibuprofen (ADVIL) 800 MG tablet Take 1 tablet (800 mg total) by mouth 3 (three) times daily. (Patient not taking: Reported on 03/21/2022) 30 tablet 0   ondansetron (ZOFRAN) 4 MG tablet Take 1 tablet (4 mg total) by mouth every 6 (six) hours. 6 tablet 0   senna-docusate (SENOKOT-S) 8.6-50 MG tablet Take 2 tablets by mouth at bedtime as needed for mild constipation.     No current facility-administered medications on file prior to visit.    No Known Allergies  Social History:  reports that she has never smoked. She has never used smokeless tobacco. She reports that she does not currently use alcohol. She reports that she does not use drugs.  Family History  Problem Relation Age of Onset   Diabetes Mother     The following portions of the patient's history were reviewed and updated as appropriate: allergies, current medications, past family history, past medical history, past social history, past surgical history and problem list.  Review of Systems Review of Systems  Gastrointestinal:  Positive for vomiting. Negative for abdominal pain.  Genitourinary:  Negative for vaginal bleeding and vaginal discharge.      Physical Exam:  BP 124/76  Pulse 71   Wt 217 lb (98.4 kg)   LMP 01/03/2022   BMI 32.99 kg/m  CONSTITUTIONAL: Well-developed, well-nourished female in no acute distress.  HENT:  Normocephalic, atraumatic, External right and left ear normal. Oropharynx is clear and moist EYES: Conjunctivae normal. No scleral icterus.  NECK: Normal range of motion, supple, no masses.  Normal thyroid.  SKIN: Skin is warm and dry. No rash noted. Not diaphoretic. No erythema. No pallor. MUSCULOSKELETAL: Normal range of motion. No tenderness.  No cyanosis, clubbing, or edema.   NEUROLOGIC: Alert and oriented to person, place, and time. Normal muscle tone coordination.  PSYCHIATRIC: Normal mood and affect. Normal behavior. Normal judgment and  thought content. CARDIOVASCULAR: Normal heart rate noted, regular rhythm RESPIRATORY: Clear to auscultation bilaterally. Effort and breath sounds normal, no problems with respiration noted. BREASTS: Declined ABDOMEN: Soft, no distention noted.  No tenderness, rebound or guarding. Fundal ht: 15 cm PELVIC: Normal appearing external genitalia; normal appearing vaginal mucosa and cervix.  Increase thin, white vaginal discharge noted.  Pap smear obtained.  Normal uterine size, no other palpable masses, no uterine or adnexal tenderness. FHR: 150 by POCUS. CRL 8.6 cm, C/W dates   Assessment:    Pregnancy: G1P0000 1. Hypertension, unspecified type - Baby ASA - Baseline PCR - No Meds  2. Supervision of high risk pregnancy, antepartum - NOB labs - Interested in [place]. Will Discuss w/ Facilators of group 7.  - Plans APF. Will verify dating first.   3. [redacted] weeks gestation of pregnancy  4. Irreg Menstrual cycles - Formal Dating [place]  Indications for ASA therapy (per uptodate) One of the following: Chronic hypertension Yes  Two or more of the following:  Sociodemographic characteristics (African American race, low socioeconomic level) Yes   Indications for early GDM screening  First-degree relative with diabetes Yes High-risk race/ethnicity (eg, African American, Latino, Native American, Asian American, [place]) Yes Hypertension or on therapy for hypertension Yes Physical inactivity No    Plan:     Initial labs drawn. Prenatal vitamins. Problem list reviewed and updated. Genetic screening discussed: NIPS/First trimester screen/Quad/AFP ordered. Plans AFP. Role of ultrasound in pregnancy discussed; Anatomy US: requested. Amniocentesis discussed: not indicated. Follow up in 4 weeks. Discussed clinic routines, schedule of care and testing, genetic screening options, involvement of students and residents under the direct supervision of APPs and doctors and  presence of female providers. Pt verbalized understanding.   Tamala Julian, [place], [place] 04/23/2022 4:08 PM

## 2022-04-23 ENCOUNTER — Ambulatory Visit (INDEPENDENT_AMBULATORY_CARE_PROVIDER_SITE_OTHER): Payer: Medicaid Other | Admitting: Advanced Practice Midwife

## 2022-04-23 ENCOUNTER — Other Ambulatory Visit (HOSPITAL_COMMUNITY)
Admission: RE | Admit: 2022-04-23 | Discharge: 2022-04-23 | Disposition: A | Discharge status: Home / Self Care | Payer: Medicaid Other | Source: Ambulatory Visit | Attending: Advanced Practice Midwife | Admitting: Advanced Practice Midwife

## 2022-04-23 VITALS — BP 124/76 | HR 71 | Wt 217.0 lb

## 2022-04-23 DIAGNOSIS — N926 Irregular menstruation, unspecified: Secondary | ICD-10-CM

## 2022-04-23 DIAGNOSIS — O0992 Supervision of high risk pregnancy, unspecified, second trimester: Secondary | ICD-10-CM

## 2022-04-23 DIAGNOSIS — O169 Unspecified maternal hypertension, unspecified trimester: Secondary | ICD-10-CM

## 2022-04-23 DIAGNOSIS — O0990 Supervision of high risk pregnancy, unspecified, unspecified trimester: Secondary | ICD-10-CM

## 2022-04-23 DIAGNOSIS — Z3A15 15 weeks gestation of pregnancy: Secondary | ICD-10-CM

## 2022-04-23 DIAGNOSIS — I10 Essential (primary) hypertension: Secondary | ICD-10-CM

## 2022-04-23 MED ORDER — CONCEPT OB 130-92.4-1 MG PO CAPS: 1.0000 | ORAL_CAPSULE | Freq: Every day | ORAL | 12 refills | Status: DC

## 2022-04-23 MED ORDER — ASPIRIN 81 MG PO TBEC: 81.0000 mg | DELAYED_RELEASE_TABLET | Freq: Every day | ORAL | 12 refills | Status: DC

## 2022-04-23 NOTE — Patient Instructions (Signed)
CenteringPregnancy is a model of care that started 30 years ago and is used in about 600 practices around the [place]. You meet with a group of 8-12 women due around the same time as you. In Centering you will have individual time with the provider and meet as a group. There's much more time for discussion and learning. You will actually have much more time with your provider in Centering than in traditional prenatal care.? You will come directly into the Centering room and will not wait in the lobby so there is no wasted time. You will have 2-hour visits every 4 weeks then every 2 weeks. You will know your Centering prenatal appointments in advance. In your last month of pregnancy, you may also come in for some individual visits. Additional appointments can be scheduled if you need more care. Studies have shown that CenteringPregnancy improves birth outcomes. We have seen especially big improvements in fewer Black women delivering babies who are too small or born too early. Visit the website www.centeringhealthcare.org for more information.

## 2022-04-24 ENCOUNTER — Ambulatory Visit
Admission: RE | Admit: 2022-04-24 | Discharge: 2022-04-24 | Disposition: A | Discharge status: Home / Self Care | Payer: Medicaid Other | Source: Ambulatory Visit | Attending: Advanced Practice Midwife | Admitting: Advanced Practice Midwife

## 2022-04-24 ENCOUNTER — Encounter: Payer: Self-pay | Admitting: Advanced Practice Midwife

## 2022-04-24 ENCOUNTER — Other Ambulatory Visit: Payer: Self-pay | Admitting: Advanced Practice Midwife

## 2022-04-24 DIAGNOSIS — O0990 Supervision of high risk pregnancy, unspecified, unspecified trimester: Secondary | ICD-10-CM

## 2022-04-24 LAB — COMPREHENSIVE METABOLIC PANEL
ALT: 16 IU/L (ref 0–32)
AST: 17 IU/L (ref 0–40)
Albumin/Globulin Ratio: 1.3 (ref 1.2–2.2)
Albumin: 4.2 g/dL (ref 4.0–5.0)
Alkaline Phosphatase: 80 IU/L (ref 44–121)
BUN/Creatinine Ratio: 9 (ref 9–23)
BUN: 7 mg/dL (ref 6–20)
Bilirubin Total: 0.5 mg/dL (ref 0.0–1.2)
CO2: 17 mmol/L — ABNORMAL LOW (ref 20–29)
Calcium: 9.2 mg/dL (ref 8.7–10.2)
Chloride: 100 mmol/L (ref 96–106)
Creatinine, Ser: 0.75 mg/dL (ref 0.57–1.00)
Globulin, Total: 3.3 g/dL (ref 1.5–4.5)
Glucose: 75 mg/dL (ref 70–99)
Potassium: 4.1 mmol/L (ref 3.5–5.2)
Sodium: 136 mmol/L (ref 134–144)
Total Protein: 7.5 g/dL (ref 6.0–8.5)
eGFR: 110 mL/min/{1.73_m2} (ref >59)

## 2022-04-24 LAB — CBC/D/PLT+RPR+RH+ABO+RUBIGG...
Antibody Screen: NEGATIVE
Basophils Absolute: 0 10*3/uL (ref 0.0–0.2)
Basos: 0 %
EOS (ABSOLUTE): 0.2 10*3/uL (ref 0.0–0.4)
Eos: 3 %
HCV Ab: NONREACTIVE
HIV Screen 4th Generation wRfx: NONREACTIVE
Hematocrit: 36.8 % (ref 34.0–46.6)
Hemoglobin: 12.8 g/dL (ref 11.1–15.9)
Hepatitis B Surface Ag: NEGATIVE
Immature Grans (Abs): 0 10*3/uL (ref 0.0–0.1)
Immature Granulocytes: 0 %
Lymphocytes Absolute: 2.6 10*3/uL (ref 0.7–3.1)
Lymphs: 38 %
MCH: 31.3 pg (ref 26.6–33.0)
MCHC: 34.8 g/dL (ref 31.5–35.7)
MCV: 90 fL (ref 79–97)
Monocytes Absolute: 0.7 10*3/uL (ref 0.1–0.9)
Monocytes: 10 %
Neutrophils Absolute: 3.3 10*3/uL (ref 1.4–7.0)
Neutrophils: 49 %
Platelets: 247 10*3/uL (ref 150–450)
RBC: 4.09 x10E6/uL (ref 3.77–5.28)
RDW: 13.1 % (ref 11.7–15.4)
RPR Ser Ql: NONREACTIVE
Rh Factor: POSITIVE
Rubella Antibodies, IGG: 2.69 index (ref >0.99)
WBC: 6.7 10*3/uL (ref 3.4–10.8)

## 2022-04-24 LAB — PROTEIN / CREATININE RATIO, URINE
Creatinine, Urine: 73.2 mg/dL
Protein, Ur: 5.4 mg/dL
Protein/Creat Ratio: 74 mg/g creat (ref 0–200)

## 2022-04-24 LAB — HEMOGLOBIN A1C
Est. average glucose Bld gHb Est-mCnc: 105 mg/dL
Hgb A1c MFr Bld: 5.3 % (ref 4.8–5.6)

## 2022-04-24 LAB — HCV INTERPRETATION

## 2022-04-25 ENCOUNTER — Encounter: Payer: Self-pay | Admitting: *Deleted

## 2022-04-25 LAB — CULTURE, OB URINE

## 2022-04-25 LAB — URINE CULTURE, OB REFLEX

## 2022-04-26 LAB — CYTOLOGY - PAP
Chlamydia: NEGATIVE
Comment: NEGATIVE
Comment: NEGATIVE
Comment: NORMAL
Diagnosis: NEGATIVE
High risk HPV: NEGATIVE
Neisseria Gonorrhea: NEGATIVE

## 2022-04-29 ENCOUNTER — Telehealth: Payer: Self-pay | Admitting: Family Medicine

## 2022-04-29 ENCOUNTER — Telehealth: Payer: Self-pay | Admitting: *Deleted

## 2022-04-29 ENCOUNTER — Encounter: Payer: Self-pay | Admitting: *Deleted

## 2022-04-29 LAB — PANORAMA PRENATAL TEST FULL PANEL:PANORAMA TEST PLUS 5 ADDITIONAL MICRODELETIONS: FETAL FRACTION: 5.6

## 2022-04-29 NOTE — Telephone Encounter (Signed)
I called Meher back and she reports work won't let her off Wednesday am and she needs pm. She requested to withdraw from Centering. [place] office has already scheduled ob appointment with traditional care.  Staci Acosta

## 2022-04-29 NOTE — Telephone Encounter (Signed)
Patient called in stating she can not do centering because her job will not let her off for those times and dates. Patient requested to go back to traditional care.

## 2022-04-29 NOTE — Telephone Encounter (Signed)
Called Ananya to discuss her MyChart message re: centering, left message re: called to discuss her message and appointments and will send MyChart message- please read and respond. Staci Acosta

## 2022-04-30 ENCOUNTER — Ambulatory Visit (INDEPENDENT_AMBULATORY_CARE_PROVIDER_SITE_OTHER): Payer: Medicaid Other

## 2022-04-30 VITALS — BP 116/59 | HR 76 | Wt 215.0 lb

## 2022-04-30 DIAGNOSIS — O10919 Unspecified pre-existing hypertension complicating pregnancy, unspecified trimester: Secondary | ICD-10-CM

## 2022-04-30 DIAGNOSIS — O10912 Unspecified pre-existing hypertension complicating pregnancy, second trimester: Secondary | ICD-10-CM

## 2022-04-30 DIAGNOSIS — Z3A15 15 weeks gestation of pregnancy: Secondary | ICD-10-CM

## 2022-04-30 DIAGNOSIS — O0990 Supervision of high risk pregnancy, unspecified, unspecified trimester: Secondary | ICD-10-CM

## 2022-04-30 DIAGNOSIS — O0992 Supervision of high risk pregnancy, unspecified, second trimester: Secondary | ICD-10-CM

## 2022-04-30 LAB — HORIZON CUSTOM: REPORT SUMMARY: NEGATIVE

## 2022-04-30 NOTE — Progress Notes (Signed)
   PRENATAL VISIT NOTE  Subjective:  Madison Allen is a 31 y.o. G2P1001 at 43w2dbeing seen today for ongoing prenatal care.  She is currently monitored for the following issues for this high-risk pregnancy and has Hypertension affecting pregnancy, antepartum; Asthma; and Supervision of high risk pregnancy, antepartum on their problem list.  Patient reports no complaints.  Contractions: Not present. Vag. Bleeding: None.  Movement: Absent. Denies leaking of fluid.   The following portions of the patient's history were reviewed and updated as appropriate: allergies, current medications, past family history, past medical history, past social history, past surgical history and problem list.   Objective:   Vitals:   04/30/22 1544  BP: (!) 116/59  Pulse: 76  Weight: 215 lb (97.5 kg)    Fetal Status: Fetal Heart Rate (bpm): 145   Movement: Absent     General:  Alert, oriented and cooperative. Patient is in no acute distress.  Skin: Skin is warm and dry. No rash noted.   Cardiovascular: Normal heart rate noted  Respiratory: Normal respiratory effort, no problems with respiration noted  Abdomen: Soft, gravid, appropriate for gestational age.  Pain/Pressure: Absent     Pelvic: Cervical exam deferred        Extremities: Normal range of motion.  Edema: None  Mental Status: Normal mood and affect. Normal behavior. Normal judgment and thought content.   Assessment and Plan:  Pregnancy: G2P1001 at 185w2d. Supervision of high risk pregnancy, antepartum - Routine OB. Doing well, no concerns - Declined AFP due to trauma from previous IV stick. Discussed recommendations and patient will consider for next visit - Anticipatory guidance for upcoming appointments provided  2. Chronic hypertension affecting pregnancy - No meds currently. Previously on Nifedipine, however patient stopped taking - Baseline labs normal. BP normotensive today - Continue bASA   Preterm labor symptoms and general  obstetric precautions including but not limited to vaginal bleeding, contractions, leaking of fluid and fetal movement were reviewed in detail with the patient. Please refer to After Visit Summary for other counseling recommendations.   Return in about 4 weeks (around 05/28/2022) for [facility] Future Appointments  Date Time Provider DeInwood9/27/2023  4:15 PM BaGriffin BasilMD [facility]ML[facility]MM[facility]10/01/2022  9:30 AM WMC-MFC NURSE [facility]10/01/2022  9:45 AM WMC-MFC US4 WMC-MFCUS WMVicksburgCNM

## 2022-05-23 ENCOUNTER — Encounter: Payer: Medicaid Other | Admitting: Obstetrics and Gynecology

## 2022-05-27 ENCOUNTER — Ambulatory Visit (INDEPENDENT_AMBULATORY_CARE_PROVIDER_SITE_OTHER): Payer: Medicaid Other | Admitting: Primary Care

## 2022-05-29 ENCOUNTER — Ambulatory Visit (INDEPENDENT_AMBULATORY_CARE_PROVIDER_SITE_OTHER): Payer: Medicaid Other | Admitting: Obstetrics and Gynecology

## 2022-05-29 ENCOUNTER — Other Ambulatory Visit: Payer: Self-pay

## 2022-05-29 VITALS — BP 128/64 | HR 80 | Wt 220.3 lb

## 2022-05-29 DIAGNOSIS — O0990 Supervision of high risk pregnancy, unspecified, unspecified trimester: Secondary | ICD-10-CM

## 2022-05-29 DIAGNOSIS — O0992 Supervision of high risk pregnancy, unspecified, second trimester: Secondary | ICD-10-CM

## 2022-05-29 DIAGNOSIS — Z3A19 19 weeks gestation of pregnancy: Secondary | ICD-10-CM

## 2022-05-29 DIAGNOSIS — O169 Unspecified maternal hypertension, unspecified trimester: Secondary | ICD-10-CM

## 2022-05-29 NOTE — Progress Notes (Signed)
   PRENATAL VISIT NOTE  Subjective:  Madison Allen is a 31 y.o. G2P1001 at 45w3dbeing seen today for ongoing prenatal care.  She is currently monitored for the following issues for this high-risk pregnancy and has Hypertension affecting pregnancy, antepartum; Asthma; and Supervision of high risk pregnancy, antepartum on their problem list.  Patient doing well with no acute concerns today. She reports no complaints.  Contractions: Not present. Vag. Bleeding: None.  Movement: Present. Denies leaking of fluid.   The following portions of the patient's history were reviewed and updated as appropriate: allergies, current medications, past family history, past medical history, past social history, past surgical history and problem list. Problem list updated.  Objective:   Vitals:   05/29/22 1643  BP: 128/64  Pulse: 80  Weight: 220 lb 4.8 oz (99.9 kg)    Fetal Status: Fetal Heart Rate (bpm): 145   Movement: Present     General:  Alert, oriented and cooperative. Patient is in no acute distress.  Skin: Skin is warm and dry. No rash noted.   Cardiovascular: Normal heart rate noted  Respiratory: Normal respiratory effort, no problems with respiration noted  Abdomen: Soft, gravid, appropriate for gestational age.  Pain/Pressure: Absent     Pelvic: Cervical exam deferred        Extremities: Normal range of motion.  Edema: None  Mental Status:  Normal mood and affect. Normal behavior. Normal judgment and thought content.   Assessment and Plan:  Pregnancy: G2P1001 at 111w3d1. Supervision of high risk pregnancy, antepartum Continue routine prenatal care - AFP, Serum, Open Spina Bifida  2. [redacted] weeks gestation of pregnancy   3. Hypertension affecting pregnancy, antepartum Pt notes intermittent use of baby ASA, advised more routine use to decrease risk of pre-eclampsia  Preterm labor symptoms and general obstetric precautions including but not limited to vaginal bleeding, contractions,  leaking of fluid and fetal movement were reviewed in detail with the patient.  Please refer to After Visit Summary for other counseling recommendations.   Return in about 4 weeks (around 06/26/2022) [facility].   LaLynnda ShieldsMD Faculty Attending Center for [facility] [address]

## 2022-05-31 LAB — AFP, SERUM, OPEN SPINA BIFIDA
AFP MoM: 1.03
AFP Value: 45.3 ng/mL
Gest. Age on Collection Date: 19 weeks
Maternal Age At EDD: 31.2 yr
OSBR Risk 1 IN: 10000
Test Results:: NEGATIVE
Weight: 220 [lb_av]

## 2022-06-07 ENCOUNTER — Ambulatory Visit: Payer: Medicaid Other | Admitting: *Deleted

## 2022-06-07 ENCOUNTER — Ambulatory Visit: Payer: Medicaid Other | Attending: Advanced Practice Midwife

## 2022-06-07 ENCOUNTER — Other Ambulatory Visit: Payer: Self-pay | Admitting: *Deleted

## 2022-06-07 ENCOUNTER — Encounter: Payer: Self-pay | Admitting: *Deleted

## 2022-06-07 VITALS — BP 128/57 | HR 74

## 2022-06-07 DIAGNOSIS — O0990 Supervision of high risk pregnancy, unspecified, unspecified trimester: Secondary | ICD-10-CM | POA: Diagnosis present

## 2022-06-07 DIAGNOSIS — I10 Essential (primary) hypertension: Secondary | ICD-10-CM | POA: Insufficient documentation

## 2022-06-07 DIAGNOSIS — Z3A20 20 weeks gestation of pregnancy: Secondary | ICD-10-CM

## 2022-06-07 DIAGNOSIS — O10012 Pre-existing essential hypertension complicating pregnancy, second trimester: Secondary | ICD-10-CM | POA: Diagnosis not present

## 2022-06-07 DIAGNOSIS — O3412 Maternal care for benign tumor of corpus uteri, second trimester: Secondary | ICD-10-CM | POA: Diagnosis not present

## 2022-06-07 DIAGNOSIS — D259 Leiomyoma of uterus, unspecified: Secondary | ICD-10-CM | POA: Diagnosis not present

## 2022-06-07 DIAGNOSIS — O10919 Unspecified pre-existing hypertension complicating pregnancy, unspecified trimester: Secondary | ICD-10-CM

## 2022-06-26 ENCOUNTER — Other Ambulatory Visit: Payer: Self-pay

## 2022-06-26 ENCOUNTER — Ambulatory Visit (INDEPENDENT_AMBULATORY_CARE_PROVIDER_SITE_OTHER): Payer: Medicaid Other | Admitting: Obstetrics and Gynecology

## 2022-06-26 VITALS — BP 123/59 | HR 83 | Wt 218.3 lb

## 2022-06-26 DIAGNOSIS — O169 Unspecified maternal hypertension, unspecified trimester: Secondary | ICD-10-CM

## 2022-06-26 DIAGNOSIS — Z3A23 23 weeks gestation of pregnancy: Secondary | ICD-10-CM

## 2022-06-26 DIAGNOSIS — O0990 Supervision of high risk pregnancy, unspecified, unspecified trimester: Secondary | ICD-10-CM

## 2022-06-26 NOTE — Progress Notes (Signed)
   PRENATAL VISIT NOTE  Subjective:  Madison Allen is a 31 y.o. G2P1001 at 25w3dbeing seen today for ongoing prenatal care.  She is currently monitored for the following issues for this high-risk pregnancy and has Hypertension affecting pregnancy, antepartum; Asthma; and Supervision of high risk pregnancy, antepartum on their problem list.  Patient doing well with no acute concerns today. She reports no complaints.  Contractions: Not present. Vag. Bleeding: None.  Movement: Present. Denies leaking of fluid.   The following portions of the patient's history were reviewed and updated as appropriate: allergies, current medications, past family history, past medical history, past social history, past surgical history and problem list. Problem list updated.  Objective:   Vitals:   06/26/22 1615  BP: (!) 123/59  Pulse: 83  Weight: 218 lb 4.8 oz (99 kg)    Fetal Status: Fetal Heart Rate (bpm): 155   Movement: Present     General:  Alert, oriented and cooperative. Patient is in no acute distress.  Skin: Skin is warm and dry. No rash noted.   Cardiovascular: Normal heart rate noted  Respiratory: Normal respiratory effort, no problems with respiration noted  Abdomen: Soft, gravid, appropriate for gestational age.  Pain/Pressure: Absent     Pelvic: Cervical exam deferred        Extremities: Normal range of motion.  Edema: None  Mental Status:  Normal mood and affect. Normal behavior. Normal judgment and thought content.   Assessment and Plan:  Pregnancy: G2P1001 at 246w3d1. [redacted] weeks gestation of pregnancy   2. Supervision of high risk pregnancy, antepartum Continue routine prenatal care  3. Hypertension affecting pregnancy, antepartum Pt is actually normotensive without meds.  She does continue baby ASA for prevention  Preterm labor symptoms and general obstetric precautions including but not limited to vaginal bleeding, contractions, leaking of fluid and fetal movement were  reviewed in detail with the patient.  Please refer to After Visit Summary for other counseling recommendations.   Return in about 4 weeks (around 07/24/2022) for in person, 2 hr GTT, 3rd trim labs.   LaLynnda ShieldsMD Faculty Attending Center for [facility]

## 2022-07-05 ENCOUNTER — Other Ambulatory Visit: Payer: Self-pay | Admitting: *Deleted

## 2022-07-05 ENCOUNTER — Ambulatory Visit: Payer: Medicaid Other | Attending: Obstetrics and Gynecology

## 2022-07-05 ENCOUNTER — Ambulatory Visit: Payer: Medicaid Other | Admitting: *Deleted

## 2022-07-05 VITALS — BP 123/57 | HR 79

## 2022-07-05 DIAGNOSIS — O10919 Unspecified pre-existing hypertension complicating pregnancy, unspecified trimester: Secondary | ICD-10-CM | POA: Diagnosis present

## 2022-07-05 DIAGNOSIS — D259 Leiomyoma of uterus, unspecified: Secondary | ICD-10-CM | POA: Diagnosis not present

## 2022-07-05 DIAGNOSIS — Z3A24 24 weeks gestation of pregnancy: Secondary | ICD-10-CM | POA: Diagnosis not present

## 2022-07-05 DIAGNOSIS — O0990 Supervision of high risk pregnancy, unspecified, unspecified trimester: Secondary | ICD-10-CM | POA: Diagnosis present

## 2022-07-05 DIAGNOSIS — O3412 Maternal care for benign tumor of corpus uteri, second trimester: Secondary | ICD-10-CM

## 2022-07-05 DIAGNOSIS — O10912 Unspecified pre-existing hypertension complicating pregnancy, second trimester: Secondary | ICD-10-CM

## 2022-07-05 DIAGNOSIS — O10012 Pre-existing essential hypertension complicating pregnancy, second trimester: Secondary | ICD-10-CM

## 2022-07-22 ENCOUNTER — Other Ambulatory Visit: Payer: Self-pay | Admitting: *Deleted

## 2022-07-22 DIAGNOSIS — O0990 Supervision of high risk pregnancy, unspecified, unspecified trimester: Secondary | ICD-10-CM

## 2022-07-24 ENCOUNTER — Encounter: Payer: Self-pay | Admitting: Obstetrics & Gynecology

## 2022-07-29 ENCOUNTER — Other Ambulatory Visit: Payer: Self-pay

## 2022-07-29 ENCOUNTER — Other Ambulatory Visit: Payer: Medicaid Other

## 2022-07-29 ENCOUNTER — Ambulatory Visit (INDEPENDENT_AMBULATORY_CARE_PROVIDER_SITE_OTHER): Payer: Medicaid Other | Admitting: Family Medicine

## 2022-07-29 ENCOUNTER — Encounter: Payer: Self-pay | Admitting: Family Medicine

## 2022-07-29 VITALS — BP 136/66 | HR 84 | Wt 220.0 lb

## 2022-07-29 DIAGNOSIS — Z3A28 28 weeks gestation of pregnancy: Secondary | ICD-10-CM

## 2022-07-29 DIAGNOSIS — O0990 Supervision of high risk pregnancy, unspecified, unspecified trimester: Secondary | ICD-10-CM

## 2022-07-29 DIAGNOSIS — O99891 Other specified diseases and conditions complicating pregnancy: Secondary | ICD-10-CM

## 2022-07-29 DIAGNOSIS — R252 Cramp and spasm: Secondary | ICD-10-CM

## 2022-07-29 DIAGNOSIS — O169 Unspecified maternal hypertension, unspecified trimester: Secondary | ICD-10-CM

## 2022-07-29 NOTE — Progress Notes (Signed)
   PRENATAL VISIT NOTE  Subjective:  Madison Allen is a 31 y.o. G2P1001 at 66w1dbeing seen today for ongoing prenatal care.  She is currently monitored for the following issues for this low-risk pregnancy and has Hypertension affecting pregnancy, antepartum; Asthma; and Supervision of high risk pregnancy, antepartum on their problem list.  Patient reports  leg cramps at night .  Contractions: Not present. Vag. Bleeding: None.  Movement: Present. Denies leaking of fluid.   The following portions of the patient's history were reviewed and updated as appropriate: allergies, current medications, past family history, past medical history, past social history, past surgical history and problem list.   Objective:   Vitals:   07/29/22 0905  BP: 136/66  Pulse: 84  Weight: 220 lb (99.8 kg)    Fetal Status: Fetal Heart Rate (bpm): 140 Fundal Height: 30 cm Movement: Present     General:  Alert, oriented and cooperative. Patient is in no acute distress.  Skin: Skin is warm and dry. No rash noted.   Cardiovascular: Normal heart rate noted  Respiratory: Normal respiratory effort, no problems with respiration noted  Abdomen: Soft, gravid, appropriate for gestational age.  Pain/Pressure: Present     Pelvic: Cervical exam deferred        Extremities: Normal range of motion.  Edema: None  Mental Status: Normal mood and affect. Normal behavior. Normal judgment and thought content.   Assessment and Plan:  Pregnancy: G2P1001 at 255w1d. Hypertension affecting pregnancy, antepartum BP normal, but slight trending up noted today.  - continue daily baby ASA - will monitor blood pressure  2. Supervision of high risk pregnancy, antepartum Doing well overall. No major concerns.   3. [redacted] weeks gestation of pregnancy Due for Tdap, but would like to postpone to next visit 3rd trimester labs drawn today.  - start visits every 2 weeks.  4. Leg cramps in pregnancy Nightly cramps. Trying to stay hydrated.  Ok to try OTC Magnesium supplement.  Preterm labor symptoms and general obstetric precautions including but not limited to vaginal bleeding, contractions, leaking of fluid and fetal movement were reviewed in detail with the patient. Please refer to After Visit Summary for other counseling recommendations.   No follow-ups on file.  Future Appointments  Date Time Provider DeEva12/04/2022  8:30 AM WMC-MFC NURSE [facility]12/04/2022  8:45 AM WMC-MFC US4 WMC-MFCUS WMTrinity  Breea Loncar MD MPH OB Fellow, FaElmhurstor WoCitrus [facility]oscopy Cent[facility] 07/29/2022

## 2022-07-30 LAB — CBC
Hematocrit: 33.1 % — ABNORMAL LOW (ref 34.0–46.6)
Hemoglobin: 11.1 g/dL (ref 11.1–15.9)
MCH: 29.8 pg (ref 26.6–33.0)
MCHC: 33.5 g/dL (ref 31.5–35.7)
MCV: 89 fL (ref 79–97)
Platelets: 231 10*3/uL (ref 150–450)
RBC: 3.73 x10E6/uL — ABNORMAL LOW (ref 3.77–5.28)
RDW: 12.4 % (ref 11.7–15.4)
WBC: 6.1 10*3/uL (ref 3.4–10.8)

## 2022-07-30 LAB — RPR: RPR Ser Ql: NONREACTIVE

## 2022-07-30 LAB — GLUCOSE TOLERANCE, 2 HOURS W/ 1HR
Glucose, 1 hour: 189 mg/dL — ABNORMAL HIGH (ref 70–179)
Glucose, 2 hour: 108 mg/dL (ref 70–152)
Glucose, Fasting: 89 mg/dL (ref 70–91)

## 2022-07-30 LAB — HIV ANTIBODY (ROUTINE TESTING W REFLEX): HIV Screen 4th Generation wRfx: NONREACTIVE

## 2022-08-02 ENCOUNTER — Telehealth: Payer: Self-pay | Admitting: *Deleted

## 2022-08-02 DIAGNOSIS — O24419 Gestational diabetes mellitus in pregnancy, unspecified control: Secondary | ICD-10-CM

## 2022-08-02 MED ORDER — ACCU-CHEK SOFTCLIX LANCETS MISC: 12 refills | Status: DC

## 2022-08-02 MED ORDER — GLUCOSE BLOOD VI STRP: ORAL_STRIP | 12 refills | Status: DC

## 2022-08-02 MED ORDER — ACCU-CHEK GUIDE W/DEVICE KIT: PACK | 0 refills | Status: DC

## 2022-08-02 NOTE — Telephone Encounter (Addendum)
-----   Message from Stormy Card, MD sent at 08/02/2022  5:03 AM EST ----- Abnormal GTT, concerning for gestational diabetes. Please inform patient and place referral to diabetes educator.  12/1  1220  Called pt and informed her of glucose test results and recommended plan of care. Diabetes Education scheduled on 12/14 @ 0815. Pt was advised that prescription for testing supplies will be sent to her pharmacy and she should bring these with her to the appointment. Pt voiced understanding.

## 2022-08-02 NOTE — Addendum Note (Signed)
Addended by: Langston Reusing on: 08/02/2022 12:34 PM   Modules accepted: Orders

## 2022-08-09 ENCOUNTER — Ambulatory Visit: Payer: Medicaid Other

## 2022-08-09 ENCOUNTER — Ambulatory Visit: Payer: Medicaid Other | Admitting: *Deleted

## 2022-08-09 ENCOUNTER — Ambulatory Visit: Payer: Medicaid Other | Attending: Obstetrics

## 2022-08-09 ENCOUNTER — Ambulatory Visit: Payer: Medicaid Other | Attending: Obstetrics and Gynecology | Admitting: Obstetrics and Gynecology

## 2022-08-09 ENCOUNTER — Other Ambulatory Visit: Payer: Self-pay | Admitting: *Deleted

## 2022-08-09 VITALS — BP 133/60 | HR 87

## 2022-08-09 DIAGNOSIS — O10913 Unspecified pre-existing hypertension complicating pregnancy, third trimester: Secondary | ICD-10-CM

## 2022-08-09 DIAGNOSIS — D259 Leiomyoma of uterus, unspecified: Secondary | ICD-10-CM

## 2022-08-09 DIAGNOSIS — O0990 Supervision of high risk pregnancy, unspecified, unspecified trimester: Secondary | ICD-10-CM

## 2022-08-09 DIAGNOSIS — Z3A29 29 weeks gestation of pregnancy: Secondary | ICD-10-CM

## 2022-08-09 DIAGNOSIS — O3413 Maternal care for benign tumor of corpus uteri, third trimester: Secondary | ICD-10-CM

## 2022-08-09 DIAGNOSIS — O24419 Gestational diabetes mellitus in pregnancy, unspecified control: Secondary | ICD-10-CM

## 2022-08-09 DIAGNOSIS — O3412 Maternal care for benign tumor of corpus uteri, second trimester: Secondary | ICD-10-CM | POA: Diagnosis present

## 2022-08-09 DIAGNOSIS — O10912 Unspecified pre-existing hypertension complicating pregnancy, second trimester: Secondary | ICD-10-CM | POA: Diagnosis present

## 2022-08-09 DIAGNOSIS — O10013 Pre-existing essential hypertension complicating pregnancy, third trimester: Secondary | ICD-10-CM

## 2022-08-09 NOTE — Progress Notes (Signed)
Maternal-Fetal Medicine   Name: Madison Allen DOB: [birth year] MRN: [id number] Referring Provider: Lynnda Shields, MD  I had the pleasure of seeing Ms. Able today at the [place] [facility]. She is G2 P1001 at 29w 5d gestation with a new diagnosis of gestational diabetes. Patient had abnormal 2-hour GTT and has an appointment for diabetic education next week.  She has not yet started checking her blood glucose levels. She has chronic hypertension that is well-controlled without antihypertensives.  Blood pressure today at our office is 133/60 mmHg. Operative history is significant for a term vaginal delivery in 2020 of a female infant weighing 7 pounds and 9 ounces at birth.  Her pregnancy and delivery were uncomplicated.  She did not have gestational diabetes in her previous pregnancy.  Ultrasound Fetal growth is appropriate for gestational age.  Amniotic fluid is normal and good fetal activity seen. 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  Gestational diabetes I explained the diagnosis of gestational diabetes.  I emphasized the importance of good blood glucose control to prevent adverse fetal or neonatal outcomes.  I discussed normal range of blood glucose  values. I encouraged her to check her blood glucose regularly.  Possible complications of gestational diabetes include fetal macrosomia, shoulder dystocia and birth injuries, stillbirth (in poorly controlled diabetes) and neonatal respiratory syndrome and other complications.  In about 85% of cases, gestational diabetes is well controlled by diet alone.  Exercise reduces the need for insulin.  Medical treatment includes oral hypoglycemics or insulin.  Timing of delivery: In well-controlled diabetes on diet, patient can be delivered at 75- or 40-weeks' gestation. Vaginal delivery is not contraindicated. Type 2 diabetes develops in up to 50% of women with GDM. I recommend postpartum  screening with 75-g glucose load at 6 to 12 weeks after delivery.  Recommendations -An appointment was made for her to return in 4 weeks for fetal growth assessment. -If patient requires metformin or insulin, we recommend weekly BPP from her next visit. -Diabetic education consultation.    Thank you for consultation.  If you have any questions or concerns, please contact me the Center for [facility].  Consultation including face-to-face (more than 50%) counseling 30 minutes.

## 2022-08-12 ENCOUNTER — Ambulatory Visit (INDEPENDENT_AMBULATORY_CARE_PROVIDER_SITE_OTHER): Payer: Medicaid Other | Admitting: Family Medicine

## 2022-08-12 ENCOUNTER — Other Ambulatory Visit: Payer: Self-pay

## 2022-08-12 VITALS — BP 126/78 | HR 80 | Wt 218.0 lb

## 2022-08-12 DIAGNOSIS — O0993 Supervision of high risk pregnancy, unspecified, third trimester: Secondary | ICD-10-CM

## 2022-08-12 DIAGNOSIS — Z23 Encounter for immunization: Secondary | ICD-10-CM | POA: Diagnosis not present

## 2022-08-12 DIAGNOSIS — O24419 Gestational diabetes mellitus in pregnancy, unspecified control: Secondary | ICD-10-CM

## 2022-08-12 DIAGNOSIS — O0990 Supervision of high risk pregnancy, unspecified, unspecified trimester: Secondary | ICD-10-CM

## 2022-08-12 DIAGNOSIS — Z3A30 30 weeks gestation of pregnancy: Secondary | ICD-10-CM | POA: Diagnosis not present

## 2022-08-12 DIAGNOSIS — O163 Unspecified maternal hypertension, third trimester: Secondary | ICD-10-CM

## 2022-08-12 DIAGNOSIS — O169 Unspecified maternal hypertension, unspecified trimester: Secondary | ICD-10-CM

## 2022-08-12 NOTE — Progress Notes (Signed)
   PRENATAL VISIT NOTE  Subjective:  Madison Allen is a 31 y.o. G2P1001 at 44w1dbeing seen today for ongoing prenatal care.  She is currently monitored for the following issues for this high-risk pregnancy and has Hypertension affecting pregnancy, antepartum; Asthma; and Supervision of high risk pregnancy, antepartum on their problem list.  Patient reports no bleeding, no contractions, no cramping, and no leaking.  Contractions: Not present. Vag. Bleeding: None.  Movement: Present. Denies leaking of fluid.   The following portions of the patient's history were reviewed and updated as appropriate: allergies, current medications, past family history, past medical history, past social history, past surgical history and problem list.   Objective:   Vitals:   08/12/22 0821  BP: 126/78  Pulse: 80  Weight: 218 lb (98.9 kg)    Fetal Status: Fetal Heart Rate (bpm): 140   Movement: Present     General:  Alert, oriented and cooperative. Patient is in no acute distress.  Skin: Skin is warm and dry. No rash noted.   Cardiovascular: Normal heart rate noted  Respiratory: Normal respiratory effort, no problems with respiration noted  Abdomen: Soft, gravid, appropriate for gestational age.  Pain/Pressure: Absent     Pelvic: Cervical exam deferred        Extremities: Normal range of motion.     Mental Status: Normal mood and affect. Normal behavior. Normal judgment and thought content.   Assessment and Plan:  Pregnancy: G2P1001 at 392w1d. [redacted] weeks gestation of pregnancy  2. Supervision of high risk pregnancy, antepartum Continue routine prenatal care  3. Hypertension affecting pregnancy, antepartum Blood pressure normal today.  Will continue to monitor.  Discussed how to check her blood pressure and patient is getting blood pressure monitor this week.  4. Gestational diabetes mellitus (GDM), antepartum, gestational diabetes method of control unspecified Patient has meeting with diabetes  coordinator this week.  Has not been checking her blood sugars.  Discussed ranges and provided blood sugar log.  Discussed dietary changes. - Patient will have 4-week fetal growth assessments. - If patient requires medications she will need to start weekly BPP's  5. Need for Tdap vaccination - Tdap vaccine greater than or equal to 7yo IM  Preterm labor symptoms and general obstetric precautions including but not limited to vaginal bleeding, contractions, leaking of fluid and fetal movement were reviewed in detail with the patient. Please refer to After Visit Summary for other counseling recommendations.   No follow-ups on file.  Future Appointments  Date Time Provider DeScottdale12/19/2023 11:15 AM WMVa [facility]LL[facility]12/20/2023  2:45 PM NDPortervilleDM CLASS NDM-NMCH NDM  08/30/2022  8:15 AM BaGriffin BasilMD WMUcsf [facility] CenterME[facility]1/01/2023  7:30 AM WMC-MFC NURSE [facility]-MFC WM[facility]1/01/2023  7:45 AM WMC-MFC US5 WMC-MFCUS WMLouisville  JoConcepcion LivingMD

## 2022-08-15 ENCOUNTER — Other Ambulatory Visit: Payer: Medicaid Other

## 2022-08-15 ENCOUNTER — Encounter: Payer: Self-pay | Admitting: *Deleted

## 2022-08-20 ENCOUNTER — Encounter: Payer: Medicaid Other | Attending: Family Medicine | Admitting: Registered"

## 2022-08-20 ENCOUNTER — Encounter: Payer: Self-pay | Admitting: Registered"

## 2022-08-20 ENCOUNTER — Other Ambulatory Visit: Payer: Self-pay

## 2022-08-20 ENCOUNTER — Ambulatory Visit (INDEPENDENT_AMBULATORY_CARE_PROVIDER_SITE_OTHER): Payer: Medicaid Other | Admitting: Registered"

## 2022-08-20 DIAGNOSIS — Z3A31 31 weeks gestation of pregnancy: Secondary | ICD-10-CM | POA: Insufficient documentation

## 2022-08-20 DIAGNOSIS — O24419 Gestational diabetes mellitus in pregnancy, unspecified control: Secondary | ICD-10-CM | POA: Diagnosis not present

## 2022-08-20 NOTE — Progress Notes (Signed)
Patient was seen for Gestational Diabetes self-management on 08/20/22  Start time 1124 and End time 1219   Estimated due date: 10/20/22; [redacted]w[redacted]d Clinical: Medications: reviewed Medical History: HTN Labs: OGTT 89-189(H)-108, A1c 5.3%   Dietary and Lifestyle History: Pt reports family history of T2D (mother).   Pt states she loves vegetables, not a fan of beans, but willing to try to eat more peas and maybe try lentils.  Pt states she often just grabs something quick for breakfast such as a piece of fruit, lunch break 10:30-11:00, afternoon snack and dinner around 9 pm.  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: not assessed Sleep: not assessed  24 hr Recall:  First Meal: fruit Snack: Second meal: chicken nuggets, fries Snack: cereal OR mini muffins OR bagel Third meal: pot roast, carrots, potatoes Snack: Beverages: mostly water  NUTRITION INTERVENTION  Nutrition education (E-1) on the following topics:   Initial Follow-up  '[x]'$  '[]'$  Definition of Gestational Diabetes '[x]'$  '[]'$  Why dietary management is important in controlling blood glucose '[x]'$  '[]'$  Effects each nutrient has on blood glucose levels '[x]'$  '[]'$  Simple carbohydrates vs complex carbohydrates '[x]'$  '[]'$  Fluid intake '[x]'$  '[]'$  Creating a balanced meal plan '[x]'$  '[]'$  Carbohydrate counting  '[x]'$  '[]'$  When to check blood glucose levels '[x]'$  '[]'$  Proper blood glucose monitoring techniques '[x]'$  '[]'$  Effect of stress and stress reduction techniques  '[x]'$  '[]'$  Exercise effect on blood glucose levels, appropriate exercise during pregnancy '[x]'$  '[]'$  Importance of limiting caffeine and abstaining from alcohol and smoking '[x]'$  '[]'$  Medications used for blood sugar control during pregnancy '[x]'$  '[]'$  Hypoglycemia and rule of 15 '[x]'$  '[]'$  Postpartum self care  Patient brought meter to visit for instruction.   Patient instructed to monitor glucose levels: FBS: 60 - ? 95 mg/dL (some clinics use 90 for cutoff) 1 hour: ? 140 mg/dL 2 hour: ? 120  mg/dL  Patient received handouts: Nutrition Diabetes and Pregnancy Carbohydrate Counting List  Patient will be seen for follow-up as needed.

## 2022-08-21 ENCOUNTER — Ambulatory Visit: Payer: Medicaid Other

## 2022-08-30 ENCOUNTER — Encounter: Payer: Medicaid Other | Admitting: Obstetrics and Gynecology

## 2022-09-02 NOTE — L&D Delivery Note (Signed)
Late entry note, due to patient care.  Delivery Note  32 y.o. admitted for IOL this morning for cHTN not on meds and GDM A1. She received one round of cytotec, including  78m oral and 277mvaginal about 0900.  At about 11.45am, I received an urgent call from the RN, because patient had precipitously delivered. RN had been called urgently by FOB and had found the patient sitting on the floor of her room, with the baby crowning, hence delivery of the baby was completed on the floor, by RN.  At the time of my arrival, patient was back in bed, and I continued with the deliver of the placenta in the usual fashion. Baby did not sustain any injury and delivery was said to be uncomplicated.  At 11:44 AM a viable female was delivered via Vaginal, Spontaneous (Presentation:   Occiput Anterior).  APGAR: 8, 9; weight 7 lb 5.8 oz (3340 g).   Placenta status: Spontaneous, Intact.  Cord: 3 vessels with the following complications: None.  Cord pH: not collected  Anesthesia: None Episiotomy: None Lacerations: 2nd degree;Perineal Suture Repair: 3.0 vicryl Est. Blood Loss (mL): 87  Mom to postpartum.  Baby to Couplet care / Skin to Skin.  ChLiliane ChannelD MPH OB Fellow, FaClarksvilleor WoBut[place]/07/2023

## 2022-09-06 ENCOUNTER — Encounter: Payer: Self-pay | Admitting: Obstetrics and Gynecology

## 2022-09-06 ENCOUNTER — Other Ambulatory Visit: Payer: Self-pay | Admitting: *Deleted

## 2022-09-06 ENCOUNTER — Ambulatory Visit (INDEPENDENT_AMBULATORY_CARE_PROVIDER_SITE_OTHER): Payer: Medicaid Other | Admitting: Obstetrics and Gynecology

## 2022-09-06 ENCOUNTER — Ambulatory Visit: Payer: Medicaid Other | Admitting: *Deleted

## 2022-09-06 ENCOUNTER — Ambulatory Visit: Payer: Medicaid Other | Attending: Obstetrics and Gynecology

## 2022-09-06 VITALS — BP 123/66 | HR 75

## 2022-09-06 VITALS — BP 123/65 | HR 79 | Wt 215.0 lb

## 2022-09-06 DIAGNOSIS — O10913 Unspecified pre-existing hypertension complicating pregnancy, third trimester: Secondary | ICD-10-CM

## 2022-09-06 DIAGNOSIS — O24410 Gestational diabetes mellitus in pregnancy, diet controlled: Secondary | ICD-10-CM

## 2022-09-06 DIAGNOSIS — O0990 Supervision of high risk pregnancy, unspecified, unspecified trimester: Secondary | ICD-10-CM

## 2022-09-06 DIAGNOSIS — O169 Unspecified maternal hypertension, unspecified trimester: Secondary | ICD-10-CM

## 2022-09-06 DIAGNOSIS — Z3A33 33 weeks gestation of pregnancy: Secondary | ICD-10-CM

## 2022-09-06 DIAGNOSIS — O10013 Pre-existing essential hypertension complicating pregnancy, third trimester: Secondary | ICD-10-CM

## 2022-09-06 DIAGNOSIS — O24419 Gestational diabetes mellitus in pregnancy, unspecified control: Secondary | ICD-10-CM | POA: Diagnosis present

## 2022-09-06 DIAGNOSIS — O3413 Maternal care for benign tumor of corpus uteri, third trimester: Secondary | ICD-10-CM | POA: Diagnosis not present

## 2022-09-06 DIAGNOSIS — J45909 Unspecified asthma, uncomplicated: Secondary | ICD-10-CM

## 2022-09-06 DIAGNOSIS — D259 Leiomyoma of uterus, unspecified: Secondary | ICD-10-CM

## 2022-09-06 DIAGNOSIS — Z362 Encounter for other antenatal screening follow-up: Secondary | ICD-10-CM

## 2022-09-06 NOTE — Progress Notes (Addendum)
   PRENATAL VISIT NOTE  Subjective:  Madison Allen is a 32 y.o. G2P1001 at 79w5dbeing seen today for ongoing prenatal care.  She is currently monitored for the following issues for this high-risk pregnancy and has Hypertension affecting pregnancy, antepartum; Asthma; Supervision of high risk pregnancy, antepartum; and Gestational diabetes mellitus (GDM) in third trimester on their problem list.  Patient reports no complaints.  Contractions: Not present. Vag. Bleeding: None.  Movement: Present. Denies leaking of fluid.   The following portions of the patient's history were reviewed and updated as appropriate: allergies, current medications, past family history, past medical history, past social history, past surgical history and problem list.   Objective:   Vitals:   09/06/22 0943  BP: 123/65  Pulse: 79  Weight: 215 lb (97.5 kg)    Fetal Status: Fetal Heart Rate (bpm): 147 Fundal Height: 33 cm Movement: Present     General:  Alert, oriented and cooperative. Patient is in no acute distress.  Skin: Skin is warm and dry. No rash noted.   Cardiovascular: Normal heart rate noted  Respiratory: Normal respiratory effort, no problems with respiration noted  Abdomen: Soft, gravid, appropriate for gestational age.  Pain/Pressure: Present     Pelvic: Cervical exam deferred        Extremities: Normal range of motion.  Edema: None  Mental Status: Normal mood and affect. Normal behavior. Normal judgment and thought content.   Assessment and Plan:  Pregnancy: G2P1001 at 357w5d. Hypertension affecting pregnancy, antepartum BP today wnl Continue serial growth US[place]oreaLast growth was 21%ile, normal AFI and AC.   2. Asthma, unspecified asthma severity, unspecified whether complicated, unspecified whether persistent Has inhaler for prn use  3. Gestational diabetes mellitus (GDM) in third trimester, gestational diabetes method of control unspecified CBGs reviewed: fastings all normal, meals mostly  normal when she checks them.  Regimen: Diet.  Growth done today - growth 18%ile (stable from previous). AC and AFI wnl.  Cephalic.  Reviewed bt CHTN and her GDM, recommend IOL at 39w. Pt agreeable.   4. Supervision of high risk pregnancy, antepartum Reviewed MOC: Slynd Discussed RSV vaccine. She will try to get at local pharmacy unless we start carrying in the office.   Preterm labor symptoms and general obstetric precautions including but not limited to vaginal bleeding, contractions, leaking of fluid and fetal movement were reviewed in detail with the patient. Please refer to After Visit Summary for other counseling recommendations.   Return in about 2 weeks (around 09/20/2022) for OB VISIT, MD only.  Future Appointments  Date Time Provider DeCatahoula2/10/2022  9:30 AM WMSaint [facility] HospitalURSE WMTampa [facility] HospitalM[facility]2/10/2022  9:45 AM WMC-MFC US4 WMC-MFCUS WMC    PaRadene GunningMD

## 2022-09-23 ENCOUNTER — Ambulatory Visit (INDEPENDENT_AMBULATORY_CARE_PROVIDER_SITE_OTHER): Payer: Medicaid Other | Admitting: Obstetrics and Gynecology

## 2022-09-23 ENCOUNTER — Other Ambulatory Visit (HOSPITAL_COMMUNITY)
Admission: RE | Admit: 2022-09-23 | Discharge: 2022-09-23 | Disposition: A | Discharge status: Home / Self Care | Payer: Medicaid Other | Source: Ambulatory Visit | Attending: Obstetrics and Gynecology | Admitting: Obstetrics and Gynecology

## 2022-09-23 ENCOUNTER — Other Ambulatory Visit: Payer: Self-pay

## 2022-09-23 VITALS — BP 129/77 | HR 86 | Wt 216.8 lb

## 2022-09-23 DIAGNOSIS — Z3A36 36 weeks gestation of pregnancy: Secondary | ICD-10-CM | POA: Diagnosis not present

## 2022-09-23 DIAGNOSIS — O26893 Other specified pregnancy related conditions, third trimester: Secondary | ICD-10-CM | POA: Diagnosis not present

## 2022-09-23 DIAGNOSIS — O24410 Gestational diabetes mellitus in pregnancy, diet controlled: Secondary | ICD-10-CM

## 2022-09-23 DIAGNOSIS — O0993 Supervision of high risk pregnancy, unspecified, third trimester: Secondary | ICD-10-CM

## 2022-09-23 DIAGNOSIS — O0990 Supervision of high risk pregnancy, unspecified, unspecified trimester: Secondary | ICD-10-CM

## 2022-09-23 DIAGNOSIS — O169 Unspecified maternal hypertension, unspecified trimester: Secondary | ICD-10-CM

## 2022-09-23 DIAGNOSIS — O163 Unspecified maternal hypertension, third trimester: Secondary | ICD-10-CM

## 2022-09-23 NOTE — Progress Notes (Signed)
   PRENATAL VISIT NOTE  Subjective:  Madison Allen is a 32 y.o. G2P1001 at 64w1dbeing seen today for ongoing prenatal care.  She is currently monitored for the following issues for this high-risk pregnancy and has Hypertension affecting pregnancy, antepartum; Asthma; Supervision of high risk pregnancy, antepartum; and Gestational diabetes mellitus (GDM) in third trimester on their problem list.  Patient doing well with no acute concerns today. She reports no complaints.  Contractions: Not present. Vag. Bleeding: None.  Movement: Present. Denies leaking of fluid.   The following portions of the patient's history were reviewed and updated as appropriate: allergies, current medications, past family history, past medical history, past social history, past surgical history and problem list. Problem list updated.  Objective:   Vitals:   09/23/22 0827  BP: 129/77  Pulse: 86  Weight: 216 lb 12.8 oz (98.3 kg)    Fetal Status: Fetal Heart Rate (bpm): 137   Movement: Present     General:  Alert, oriented and cooperative. Patient is in no acute distress.  Skin: Skin is warm and dry. No rash noted.   Cardiovascular: Normal heart rate noted  Respiratory: Normal respiratory effort, no problems with respiration noted  Abdomen: Soft, gravid, appropriate for gestational age.  Pain/Pressure: Present     Pelvic: Cervical exam performed        Extremities: Normal range of motion.  Edema: Trace  Mental Status:  Normal mood and affect. Normal behavior. Normal judgment and thought content.   Assessment and Plan:  Pregnancy: G2P1001 at 353w1d1. Supervision of high risk pregnancy, antepartum Continue routine prenatal care  2. [redacted] weeks gestation of pregnancy   3. Hypertension affecting pregnancy, antepartum Pt not checking blood pressure, BP currently normal  4. Diet controlled gestational diabetes mellitus (GDM) in third trimester FBS:  87-99 Pt has not been checking any postprandial blood  sugars. She has been asked to check at least 1-2 /day  Preterm labor symptoms and general obstetric precautions including but not limited to vaginal bleeding, contractions, leaking of fluid and fetal movement were reviewed in detail with the patient.  Please refer to After Visit Summary for other counseling recommendations.   Return in about 1 week (around 09/30/2022) [facility].   LaLynnda ShieldsMD Faculty Attending Center for [facility]

## 2022-09-24 ENCOUNTER — Encounter: Payer: Self-pay | Admitting: Obstetrics and Gynecology

## 2022-09-24 LAB — GC/CHLAMYDIA PROBE AMP (~~LOC~~) NOT AT ARMC
Chlamydia: NEGATIVE
Comment: NEGATIVE
Comment: NORMAL
Neisseria Gonorrhea: NEGATIVE

## 2022-09-27 LAB — CULTURE, BETA STREP (GROUP B ONLY): Strep Gp B Culture: NEGATIVE

## 2022-10-01 ENCOUNTER — Ambulatory Visit (INDEPENDENT_AMBULATORY_CARE_PROVIDER_SITE_OTHER): Payer: Medicaid Other | Admitting: Family Medicine

## 2022-10-01 ENCOUNTER — Encounter: Payer: Self-pay | Admitting: Family Medicine

## 2022-10-01 ENCOUNTER — Other Ambulatory Visit: Payer: Self-pay

## 2022-10-01 VITALS — BP 129/80 | HR 81 | Wt 221.0 lb

## 2022-10-01 DIAGNOSIS — O0990 Supervision of high risk pregnancy, unspecified, unspecified trimester: Secondary | ICD-10-CM

## 2022-10-01 DIAGNOSIS — O0993 Supervision of high risk pregnancy, unspecified, third trimester: Secondary | ICD-10-CM

## 2022-10-01 DIAGNOSIS — O163 Unspecified maternal hypertension, third trimester: Secondary | ICD-10-CM

## 2022-10-01 DIAGNOSIS — O24410 Gestational diabetes mellitus in pregnancy, diet controlled: Secondary | ICD-10-CM

## 2022-10-01 DIAGNOSIS — Z3A37 37 weeks gestation of pregnancy: Secondary | ICD-10-CM

## 2022-10-01 DIAGNOSIS — O169 Unspecified maternal hypertension, unspecified trimester: Secondary | ICD-10-CM

## 2022-10-01 NOTE — Patient Instructions (Signed)

## 2022-10-01 NOTE — Progress Notes (Signed)
   Subjective:  Madison Allen is a 32 y.o. G2P1001 at 110w2dbeing seen today for ongoing prenatal care.  She is currently monitored for the following issues for this high-risk pregnancy and has Hypertension affecting pregnancy, antepartum; Asthma; Supervision of high risk pregnancy, antepartum; and Gestational diabetes mellitus (GDM) in third trimester on their problem list.  Patient reports no complaints.  Contractions: Not present. Vag. Bleeding: None.  Movement: Present. Denies leaking of fluid.   The following portions of the patient's history were reviewed and updated as appropriate: allergies, current medications, past family history, past medical history, past social history, past surgical history and problem list. Problem list updated.  Objective:   Vitals:   10/01/22 1551  BP: 129/80  Pulse: 81  Weight: 221 lb (100.2 kg)    Fetal Status: Fetal Heart Rate (bpm): 148   Movement: Present     General:  Alert, oriented and cooperative. Patient is in no acute distress.  Skin: Skin is warm and dry. No rash noted.   Cardiovascular: Normal heart rate noted  Respiratory: Normal respiratory effort, no problems with respiration noted  Abdomen: Soft, gravid, appropriate for gestational age. Pain/Pressure: Present     Pelvic: Vag. Bleeding: None     Cervical exam deferred        Extremities: Normal range of motion.     Mental Status: Normal mood and affect. Normal behavior. Normal judgment and thought content.   Urinalysis:      Assessment and Plan:  Pregnancy: G2P1001 at 370w2d1. Supervision of high risk pregnancy, antepartum BP and FHR normal  2. Hypertension affecting pregnancy, antepartum Normotensive without meds Taking ASA  3. Diet controlled gestational diabetes mellitus (GDM) in third trimester Log reviewed, well controlled despite infrequent post prandial checks Last growth [place]/01/2023, has repeat on 10/04/2022 Last EFW/AC/AFI all normal Discussed Iol at 39 wks, after  reviewing schedule she was put on for 10/13/22 Form faxed, orders placed  Term labor symptoms and general obstetric precautions including but not limited to vaginal bleeding, contractions, leaking of fluid and fetal movement were reviewed in detail with the patient. Please refer to After Visit Summary for other counseling recommendations.  Return in 1 week (on 10/08/2022) [facility].   EcClarnce FlockMD

## 2022-10-04 ENCOUNTER — Ambulatory Visit: Payer: Medicaid Other | Attending: Maternal & Fetal Medicine

## 2022-10-04 ENCOUNTER — Ambulatory Visit: Payer: Medicaid Other | Admitting: *Deleted

## 2022-10-04 VITALS — BP 133/75 | HR 79

## 2022-10-04 DIAGNOSIS — O24419 Gestational diabetes mellitus in pregnancy, unspecified control: Secondary | ICD-10-CM | POA: Diagnosis present

## 2022-10-04 DIAGNOSIS — O0990 Supervision of high risk pregnancy, unspecified, unspecified trimester: Secondary | ICD-10-CM | POA: Diagnosis present

## 2022-10-04 DIAGNOSIS — O10013 Pre-existing essential hypertension complicating pregnancy, third trimester: Secondary | ICD-10-CM

## 2022-10-04 DIAGNOSIS — O10913 Unspecified pre-existing hypertension complicating pregnancy, third trimester: Secondary | ICD-10-CM | POA: Diagnosis present

## 2022-10-04 DIAGNOSIS — O24410 Gestational diabetes mellitus in pregnancy, diet controlled: Secondary | ICD-10-CM | POA: Diagnosis not present

## 2022-10-04 DIAGNOSIS — D259 Leiomyoma of uterus, unspecified: Secondary | ICD-10-CM | POA: Diagnosis not present

## 2022-10-04 DIAGNOSIS — Z3A37 37 weeks gestation of pregnancy: Secondary | ICD-10-CM

## 2022-10-04 DIAGNOSIS — O3413 Maternal care for benign tumor of corpus uteri, third trimester: Secondary | ICD-10-CM | POA: Diagnosis not present

## 2022-10-07 ENCOUNTER — Ambulatory Visit (INDEPENDENT_AMBULATORY_CARE_PROVIDER_SITE_OTHER): Payer: Medicaid Other | Admitting: Family Medicine

## 2022-10-07 ENCOUNTER — Telehealth (HOSPITAL_COMMUNITY): Payer: Self-pay | Admitting: *Deleted

## 2022-10-07 ENCOUNTER — Other Ambulatory Visit: Payer: Self-pay

## 2022-10-07 ENCOUNTER — Other Ambulatory Visit (HOSPITAL_COMMUNITY): Payer: Self-pay | Admitting: Advanced Practice Midwife

## 2022-10-07 ENCOUNTER — Encounter (HOSPITAL_COMMUNITY): Payer: Self-pay | Admitting: *Deleted

## 2022-10-07 VITALS — BP 118/69 | HR 76 | Wt 218.4 lb

## 2022-10-07 DIAGNOSIS — O163 Unspecified maternal hypertension, third trimester: Secondary | ICD-10-CM

## 2022-10-07 DIAGNOSIS — Z3A38 38 weeks gestation of pregnancy: Secondary | ICD-10-CM

## 2022-10-07 DIAGNOSIS — O24410 Gestational diabetes mellitus in pregnancy, diet controlled: Secondary | ICD-10-CM

## 2022-10-07 DIAGNOSIS — O0990 Supervision of high risk pregnancy, unspecified, unspecified trimester: Secondary | ICD-10-CM

## 2022-10-07 DIAGNOSIS — O169 Unspecified maternal hypertension, unspecified trimester: Secondary | ICD-10-CM

## 2022-10-07 DIAGNOSIS — O0993 Supervision of high risk pregnancy, unspecified, third trimester: Secondary | ICD-10-CM

## 2022-10-07 NOTE — Telephone Encounter (Signed)
Preadmission screen  

## 2022-10-07 NOTE — Progress Notes (Unsigned)
   PRENATAL VISIT NOTE  Subjective:  Madison Allen is a 32 y.o. G2P1001 at 32w1dbeing seen today for ongoing prenatal care.  She is currently monitored for the following issues for this high-risk pregnancy and has Hypertension affecting pregnancy, antepartum; Asthma; Supervision of high risk pregnancy, antepartum; and Gestational diabetes mellitus (GDM) in third trimester on their problem list.  Patient reports no complaints.  Contractions: Not present. Vag. Bleeding: None.  Movement: Present. Denies leaking of fluid.   The following portions of the patient's history were reviewed and updated as appropriate: allergies, current medications, past family history, past medical history, past social history, past surgical history and problem list.   Objective:   Vitals:   10/07/22 1322 10/07/22 1330  BP: 136/80 118/69  Pulse: 76 76  Weight: 218 lb 6.4 oz (99.1 kg)     Fetal Status:     Movement: Present    Fetal HR: 138 Fundal Height: 38 cm  General:  Alert, oriented and cooperative. Patient is in no acute distress.  Skin: Skin is warm and dry. No rash noted.   Cardiovascular: Normal heart rate noted  Respiratory: Normal respiratory effort, no problems with respiration noted  Abdomen: Soft, gravid, appropriate for gestational age.  Pain/Pressure: Present     Pelvic: Cervical exam deferred        Extremities: Normal range of motion.  Edema: None  Mental Status: Normal mood and affect. Normal behavior. Normal judgment and thought content.   Assessment and Plan:  Pregnancy: G2P1001 at 321w1d. Supervision of high risk pregnancy, antepartum -no acute concerns today  2. Diet controlled gestational diabetes mellitus (GDM) in third trimester - blood sugars <120 fasting on diet controlled  3. Hypertension affecting pregnancy, antepartum - controlled, regular checks at home, today 118/69  4. [redacted] weeks gestation of pregnancy - scheduled induction 10/13/2022  Preterm labor symptoms and  general obstetric precautions including but not limited to vaginal bleeding, contractions, leaking of fluid and fetal movement were reviewed in detail with the patient. Please refer to After Visit Summary for other counseling recommendations.   No follow-ups on file.  Future Appointments  Date Time Provider DeCunningham2/07/2023  6:30 AM [facility]    LiDyanne IhaStudent-PA

## 2022-10-09 ENCOUNTER — Other Ambulatory Visit: Payer: Self-pay | Admitting: Advanced Practice Midwife

## 2022-10-11 ENCOUNTER — Other Ambulatory Visit: Payer: Self-pay | Admitting: Advanced Practice Midwife

## 2022-10-13 ENCOUNTER — Inpatient Hospital Stay (HOSPITAL_COMMUNITY)
Admission: RE | Admit: 2022-10-13 | Discharge: 2022-10-14 | DRG: 806 | Disposition: A | Discharge status: Home / Self Care | Payer: Medicaid Other | Attending: Family Medicine | Admitting: Family Medicine

## 2022-10-13 ENCOUNTER — Inpatient Hospital Stay (HOSPITAL_COMMUNITY): Payer: Medicaid Other

## 2022-10-13 ENCOUNTER — Other Ambulatory Visit: Payer: Self-pay

## 2022-10-13 ENCOUNTER — Encounter (HOSPITAL_COMMUNITY): Payer: Self-pay | Admitting: Family Medicine

## 2022-10-13 DIAGNOSIS — Z7982 Long term (current) use of aspirin: Secondary | ICD-10-CM

## 2022-10-13 DIAGNOSIS — O1002 Pre-existing essential hypertension complicating childbirth: Secondary | ICD-10-CM | POA: Diagnosis present

## 2022-10-13 DIAGNOSIS — Z3A39 39 weeks gestation of pregnancy: Secondary | ICD-10-CM

## 2022-10-13 DIAGNOSIS — O24420 Gestational diabetes mellitus in childbirth, diet controlled: Secondary | ICD-10-CM | POA: Diagnosis present

## 2022-10-13 DIAGNOSIS — O0990 Supervision of high risk pregnancy, unspecified, unspecified trimester: Secondary | ICD-10-CM

## 2022-10-13 DIAGNOSIS — O24410 Gestational diabetes mellitus in pregnancy, diet controlled: Secondary | ICD-10-CM

## 2022-10-13 DIAGNOSIS — O24419 Gestational diabetes mellitus in pregnancy, unspecified control: Secondary | ICD-10-CM | POA: Diagnosis present

## 2022-10-13 DIAGNOSIS — O169 Unspecified maternal hypertension, unspecified trimester: Secondary | ICD-10-CM | POA: Diagnosis present

## 2022-10-13 LAB — TYPE AND SCREEN
ABO/RH(D): O POS
Antibody Screen: NEGATIVE

## 2022-10-13 LAB — CBC
HCT: 37.4 % (ref 36.0–46.0)
Hemoglobin: 13 g/dL (ref 12.0–15.0)
MCH: 30.4 pg (ref 26.0–34.0)
MCHC: 34.8 g/dL (ref 30.0–36.0)
MCV: 87.6 fL (ref 80.0–100.0)
Platelets: 236 10*3/uL (ref 150–400)
RBC: 4.27 MIL/uL (ref 3.87–5.11)
RDW: 14 % (ref 11.5–15.5)
WBC: 6.2 10*3/uL (ref 4.0–10.5)
nRBC: 0 % (ref 0.0–0.2)

## 2022-10-13 LAB — COMPREHENSIVE METABOLIC PANEL
ALT: 17 U/L (ref 0–44)
AST: 23 U/L (ref 15–41)
Albumin: 3.1 g/dL — ABNORMAL LOW (ref 3.5–5.0)
Alkaline Phosphatase: 195 U/L — ABNORMAL HIGH (ref 38–126)
Anion gap: 9 (ref 5–15)
BUN: 6 mg/dL (ref 6–20)
CO2: 23 mmol/L (ref 22–32)
Calcium: 9.1 mg/dL (ref 8.9–10.3)
Chloride: 103 mmol/L (ref 98–111)
Creatinine, Ser: 0.85 mg/dL (ref 0.44–1.00)
GFR, Estimated: >60 mL/min (ref >60)
Glucose, Bld: 100 mg/dL — ABNORMAL HIGH (ref 70–99)
Potassium: 3.7 mmol/L (ref 3.5–5.1)
Sodium: 135 mmol/L (ref 135–145)
Total Bilirubin: 0.8 mg/dL (ref 0.3–1.2)
Total Protein: 7.5 g/dL (ref 6.5–8.1)

## 2022-10-13 LAB — GLUCOSE, CAPILLARY: Glucose-Capillary: 90 mg/dL (ref 70–99)

## 2022-10-13 LAB — PROTEIN / CREATININE RATIO, URINE
Creatinine, Urine: 132 mg/dL
Protein Creatinine Ratio: 0.08 mg/mg{Cre} (ref 0.00–0.15)
Total Protein, Urine: 11 mg/dL

## 2022-10-13 LAB — RPR: RPR Ser Ql: NONREACTIVE

## 2022-10-13 MED ORDER — ZOLPIDEM TARTRATE 5 MG PO TABS: 5.0000 mg | ORAL_TABLET | Freq: Every evening | ORAL | Status: DC | PRN

## 2022-10-13 MED ORDER — DIPHENHYDRAMINE HCL 25 MG PO CAPS: 25.0000 mg | ORAL_CAPSULE | Freq: Four times a day (QID) | ORAL | Status: DC | PRN

## 2022-10-13 MED ORDER — OXYTOCIN-SODIUM CHLORIDE 30-0.9 UT/500ML-% IV SOLN: 1.0000 m[IU]/min | INTRAVENOUS | Status: DC

## 2022-10-13 MED ORDER — FENTANYL CITRATE (PF) 100 MCG/2ML IJ SOLN
50.0000 ug | INTRAMUSCULAR | Status: DC | PRN
  Administered 2022-10-13: 100 ug via INTRAVENOUS
  Filled 2022-10-13: qty 2

## 2022-10-13 MED ORDER — SOD CITRATE-CITRIC ACID 500-334 MG/5ML PO SOLN: 30.0000 mL | ORAL | Status: DC | PRN

## 2022-10-13 MED ORDER — ONDANSETRON HCL 4 MG PO TABS: 4.0000 mg | ORAL_TABLET | ORAL | Status: DC | PRN

## 2022-10-13 MED ORDER — FUROSEMIDE 20 MG PO TABS
20.0000 mg | ORAL_TABLET | Freq: Every day | ORAL | Status: DC
  Administered 2022-10-14: 20 mg via ORAL
  Filled 2022-10-13: qty 1

## 2022-10-13 MED ORDER — ACETAMINOPHEN 325 MG PO TABS
650.0000 mg | ORAL_TABLET | ORAL | Status: DC | PRN
  Administered 2022-10-13: 650 mg via ORAL
  Filled 2022-10-13: qty 2

## 2022-10-13 MED ORDER — KETOROLAC TROMETHAMINE 30 MG/ML IJ SOLN
15.0000 mg | Freq: Once | INTRAMUSCULAR | Status: AC
  Administered 2022-10-13: 15 mg via INTRAVENOUS
  Filled 2022-10-13: qty 1

## 2022-10-13 MED ORDER — BENZOCAINE-MENTHOL 20-0.5 % EX AERO
1.0000 | INHALATION_SPRAY | CUTANEOUS | Status: DC | PRN
  Administered 2022-10-13: 1 via TOPICAL
  Filled 2022-10-13: qty 56

## 2022-10-13 MED ORDER — PRENATAL MULTIVITAMIN CH
1.0000 | ORAL_TABLET | Freq: Every day | ORAL | Status: DC
  Administered 2022-10-14: 1 via ORAL
  Filled 2022-10-13: qty 1

## 2022-10-13 MED ORDER — WITCH HAZEL-GLYCERIN EX PADS: 1.0000 | MEDICATED_PAD | CUTANEOUS | Status: DC | PRN

## 2022-10-13 MED ORDER — ALBUTEROL SULFATE (2.5 MG/3ML) 0.083% IN NEBU: 2.5000 mg | INHALATION_SOLUTION | Freq: Four times a day (QID) | RESPIRATORY_TRACT | Status: DC | PRN

## 2022-10-13 MED ORDER — IBUPROFEN 600 MG PO TABS
600.0000 mg | ORAL_TABLET | Freq: Four times a day (QID) | ORAL | Status: DC
  Administered 2022-10-13 – 2022-10-14 (×4): 600 mg via ORAL
  Filled 2022-10-13 (×4): qty 1

## 2022-10-13 MED ORDER — ONDANSETRON HCL 4 MG/2ML IJ SOLN: 4.0000 mg | INTRAMUSCULAR | Status: DC | PRN

## 2022-10-13 MED ORDER — LIDOCAINE HCL (PF) 1 % IJ SOLN
30.0000 mL | INTRAMUSCULAR | Status: AC | PRN
  Administered 2022-10-13: 30 mL via SUBCUTANEOUS
  Filled 2022-10-13: qty 30

## 2022-10-13 MED ORDER — COCONUT OIL OIL: 1.0000 | TOPICAL_OIL | Status: DC | PRN

## 2022-10-13 MED ORDER — TERBUTALINE SULFATE 1 MG/ML IJ SOLN: 0.2500 mg | Freq: Once | INTRAMUSCULAR | Status: DC | PRN

## 2022-10-13 MED ORDER — LACTATED RINGERS IV SOLN: 500.0000 mL | INTRAVENOUS | Status: DC | PRN

## 2022-10-13 MED ORDER — DIBUCAINE (PERIANAL) 1 % EX OINT: 1.0000 | TOPICAL_OINTMENT | CUTANEOUS | Status: DC | PRN

## 2022-10-13 MED ORDER — OXYCODONE HCL 5 MG PO TABS: 5.0000 mg | ORAL_TABLET | ORAL | Status: DC | PRN

## 2022-10-13 MED ORDER — MISOPROSTOL 25 MCG QUARTER TABLET
25.0000 ug | ORAL_TABLET | ORAL | Status: DC | PRN
  Administered 2022-10-13: 25 ug via VAGINAL

## 2022-10-13 MED ORDER — ONDANSETRON HCL 4 MG/2ML IJ SOLN: 4.0000 mg | Freq: Four times a day (QID) | INTRAMUSCULAR | Status: DC | PRN

## 2022-10-13 MED ORDER — SENNOSIDES-DOCUSATE SODIUM 8.6-50 MG PO TABS
2.0000 | ORAL_TABLET | ORAL | Status: DC
  Administered 2022-10-13: 2 via ORAL
  Filled 2022-10-13: qty 2

## 2022-10-13 MED ORDER — OXYTOCIN BOLUS FROM INFUSION
333.0000 mL | Freq: Once | INTRAVENOUS | Status: AC
  Administered 2022-10-13: 333 mL via INTRAVENOUS

## 2022-10-13 MED ORDER — MISOPROSTOL 50MCG HALF TABLET
50.0000 ug | ORAL_TABLET | Freq: Once | ORAL | Status: AC
  Administered 2022-10-13: 50 ug via ORAL
  Filled 2022-10-13: qty 1

## 2022-10-13 MED ORDER — OXYTOCIN-SODIUM CHLORIDE 30-0.9 UT/500ML-% IV SOLN
2.5000 [IU]/h | INTRAVENOUS | Status: DC
  Filled 2022-10-13: qty 500

## 2022-10-13 MED ORDER — MISOPROSTOL 25 MCG QUARTER TABLET
25.0000 ug | ORAL_TABLET | Freq: Once | ORAL | Status: DC
  Filled 2022-10-13: qty 1

## 2022-10-13 MED ORDER — SODIUM CHLORIDE 0.9% FLUSH: 3.0000 mL | Freq: Two times a day (BID) | INTRAVENOUS | Status: DC

## 2022-10-13 MED ORDER — SODIUM CHLORIDE 0.9 % IV SOLN: INTRAVENOUS | Status: DC | PRN

## 2022-10-13 MED ORDER — LACTATED RINGERS IV SOLN: INTRAVENOUS | Status: DC

## 2022-10-13 MED ORDER — ACETAMINOPHEN 325 MG PO TABS: 650.0000 mg | ORAL_TABLET | ORAL | Status: DC | PRN

## 2022-10-13 MED ORDER — SIMETHICONE 80 MG PO CHEW: 80.0000 mg | CHEWABLE_TABLET | ORAL | Status: DC | PRN

## 2022-10-13 MED ORDER — SODIUM CHLORIDE 0.9% FLUSH: 3.0000 mL | INTRAVENOUS | Status: DC | PRN

## 2022-10-13 NOTE — Discharge Summary (Shared)
Postpartum Discharge Summary  Date of Service updated***     Patient Name: Madison Allen DOB: 01-01-1991 MRN: [id number]  Date of admission: 10/13/2022 Delivery date:10/13/2022  Delivering provider: Krystal Eaton B  Date of discharge: 10/13/2022  Admitting diagnosis: Gestational diabetes mellitus (GDM) [O24.419] Intrauterine pregnancy: [redacted]w[redacted]d    Secondary diagnosis:  Principal Problem:   Gestational diabetes mellitus (GDM) Active Problems:   Hypertension affecting pregnancy, antepartum   Supervision of high risk pregnancy, antepartum   Vaginal delivery  Additional problems: Precipitous delivery    Discharge diagnosis: Term Pregnancy Delivered, CHTN, and GDM A1                                              Post partum procedures:{Postpartum procedures:23558} Augmentation: Cytotec Complications: None  Hospital course: Induction of Labor With Vaginal Delivery   32y.o. yo G2P2002 at 36w0das admitted to the hospital 10/13/2022 for induction of labor.  Indication for induction: A1 DM and chronic hypertension .  Patient had an labor course complicated by a precipitous labor, following one round of misoprostol leading to a precipitous delivery Membrane Rupture Time/Date: 11:44 AM ,10/13/2022   Delivery Method:Vaginal, Spontaneous  Episiotomy: None  Lacerations:  2nd degree;Perineal  Details of delivery can be found in separate delivery note.  Patient had a postpartum course complicated by***. Patient is discharged home 10/13/22.  Newborn Data: Birth date:10/13/2022  Birth time:11:44 AM  Gender:Female  Living status:Living  Apgars:8 ,9  Weight:3340 g   Magnesium Sulfate received: {Mag received:30440022} BMZ received: No Rhophylac:N/A MMR:N/A, Rubella Immune T-DaP:Given prenatally Flu: {FWG:1132360ransfusion:{Transfusion received:30440034}  Physical exam  Vitals:   10/13/22 1331 10/13/22 1338 10/13/22 1400 10/13/22 1531  BP: (!) 153/80 136/81 134/75 134/75  Pulse: 85  80 73 77  Resp:      Temp:   98.3 F (36.8 C) 98.2 F (36.8 C)  TempSrc:      SpO2:   100% 100%  Weight:      Height:       General: {Exam; general:21111117} Lochia: {Desc; appropriate/inappropriate:30686::"appropriate"} Uterine Fundus: {Desc; firm/soft:30687} Incision: {Exam; incision:21111123} DVT Evaluation: {Exam; dvt:2111122} Labs: Lab Results  Component Value Date   WBC 6.2 10/13/2022   HGB 13.0 10/13/2022   HCT 37.4 10/13/2022   MCV 87.6 10/13/2022   PLT 236 10/13/2022      Latest Ref Rng & Units 10/13/2022    7:08 AM  CMP  Glucose 70 - 99 mg/dL 100   BUN 6 - 20 mg/dL 6   Creatinine 0.44 - 1.00 mg/dL 0.85   Sodium 135 - 145 mmol/L 135   Potassium 3.5 - 5.1 mmol/L 3.7   Chloride 98 - 111 mmol/L 103   CO2 22 - 32 mmol/L 23   Calcium 8.9 - 10.3 mg/dL 9.1   Total Protein 6.5 - 8.1 g/dL 7.5   Total Bilirubin 0.3 - 1.2 mg/dL 0.8   Alkaline Phos 38 - 126 U/L 195   AST 15 - 41 U/L 23   ALT 0 - 44 U/L 17    Edinburgh Score:    02/24/2019    1:25 PM  Edinburgh Postnatal Depression Scale Screening Tool  I have been able to laugh and see the funny side of things. 0  I have looked forward with enjoyment to things. 0  I have blamed myself unnecessarily when things went wrong.  0  I have been anxious or worried for no good reason. 2  I have felt scared or panicky for no good reason. 0  Things have been getting on top of me. 0  I have been so unhappy that I have had difficulty sleeping. 2  I have felt sad or miserable. 0  I have been so unhappy that I have been crying. 0  The thought of harming myself has occurred to me. 0  Edinburgh Postnatal Depression Scale Total 4     After visit meds:  Allergies as of 10/13/2022   No Known Allergies   Med Rec must be completed prior to using this [facility]***        Discharge home in stable condition Infant Feeding: {Baby feeding:23562} Infant Disposition:{CHL IP OB HOME WITH OP:[id number] Discharge instruction: per  After Visit Summary and Postpartum booklet. Activity: Advance as tolerated. Pelvic rest for 6 weeks.  Diet: {OB TF:3416389 Future Appointments:No future appointments. Follow up Visit:  The following message was sent to [facility] by Mikki Santee, MD  Please schedule this patient for a In person postpartum visit in 6 weeks with the following provider: Any provider. Additional Postpartum F/U:2 hour GTT at 6 week appointment and BP check 1 week  High risk pregnancy complicated by: GDM and HTN Delivery mode:  Vaginal, Spontaneous  Anticipated Birth Control:  POPs - slynd.   10/13/2022 Ermie Glendenning Sherrilyn Rist, MD

## 2022-10-13 NOTE — H&P (Signed)
OBSTETRIC ADMISSION HISTORY AND PHYSICAL  Madison Allen is a 32 y.o. female G2P1001 with IUP at 64w0dby 14wk U/S presenting for IOL for A1GDM+cHTN (no meds) . She reports +FMs, No LOF, no VB, no blurry vision, headaches or peripheral edema, and RUQ pain.  She plans on both breast and formula feeding. She request POPs (Slynd) for birth control. [facility]  Dating: By 14wk U/S --->  Estimated Date of Delivery: 10/20/22  Sono:  @[redacted]w[redacted]d$ , CWD, normal anatomy, cephalic presentation, anterior placenta, 3221g, 54% EFW 7lb 2oz   Prenatal History/Complications: taking low dose aspirin daily Chronic hypertension-no medications Well-controlled A1 GDM  Nursing Staff Provider  Office Location  [facility] Dating  14 wk [place] PD[facility] HlthcareModel [Valu.Nieves] Traditional [ ]$  Centering-Interested [ ]$  Mom-Baby Dyad    Language  English  Anatomy [place] Nml  Flu Vaccine  Declined-05/29/22 Genetic/Carrier Screen  NIPS: Low risk female AFP:  neg Horizon: Nml  TDaP Vaccine   Given 08/12/22 Hgb A1C or  GTT Early  Third trimester - abnormal  COVID Vaccine    LAB RESULTS   Rhogam  NA Blood Type O/Positive/-- (08/22 1629) O pos   Baby Feeding Plan Breast & Bottle Antibody Negative (08/22 1629)  Contraception Slynd  Rubella 2.69 (08/22 1629)  Circumcision If boy, Yes  RPR Non Reactive (11/27 0829)   PMalvernPediatric  HBsAg Negative (08/22 1629)   Support Person FOB HCVAb Neg   Prenatal Classes  HIV Non Reactive (11/27 0829)     BTL Consent  GBS   negative (For PCN allergy, check sensitivities)   VBAC Consent NA Pap WNL 04/2022       DME Rx [ ]$  BP cuff [ ]$  Weight Scale Waterbirth  [ ]$  Class [ ]$  Consent [ ]$  CNM visit  PHQ9 & GAD7 [  ] new OB [  ] 28 weeks  [  ] 36 weeks Induction  [ ]$  Orders Entered [ ]$ Foley Y/N   Past Medical History: Past Medical History:  Diagnosis Date   Asthma    last used inhaler in January   Gestational diabetes    Hypertension    Past Surgical  History: Past Surgical History:  Procedure Laterality Date   NO PAST SURGERIES     Obstetrical History: OB History     Gravida  2   Para  1   Term  1   Preterm      AB      Living  1      SAB      IAB      Ectopic      Multiple  0   Live Births  1          Social History Social History   Socioeconomic History   Marital status: Single    Spouse name: Not on file   Number of children: Not on file   Years of education: High School   Highest education level: Associate degree: academic program  Occupational History   Not on file  Tobacco Use   Smoking status: Never   Smokeless tobacco: Never  Vaping Use   Vaping Use: Never used  Substance and Sexual Activity   Alcohol use: Not Currently   Drug use: Never   Sexual activity: Yes    Birth control/protection: None  Other Topics Concern   Not on file  Social History Narrative   Not on file   Social  Determinants of Health   Financial Resource Strain: Low Risk  (08/12/2022)   Overall Financial Resource Strain (CARDIA)    Difficulty of Paying Living Expenses: Not very hard  Food Insecurity: No Food Insecurity (10/13/2022)   Hunger Vital Sign    Worried About Running Out of Food in the Last Year: Never true    Ran Out of Food in the Last Year: Never true  Transportation Needs: No Transportation Needs (10/13/2022)   PRAPARE - Hydrologist (Medical): No    Lack of Transportation (Non-Medical): No  Physical Activity: Insufficiently Active (08/12/2022)   Exercise Vital Sign    Days of Exercise per Week: 1 day    Minutes of Exercise per Session: 20 min  Stress: No Stress Concern Present (08/12/2022)   [place]    Feeling of Stress : Not at all  Social Connections: [place] (08/12/2022)   Social Connection and Isolation Panel [NHANES]    Frequency of Communication with Friends and Family: More than  three times a week    Frequency of Social Gatherings with Friends and Family: More than three times a week    Attends Religious Services: 1 to 4 times per year    Active Member of [employer] or Organizations: Yes    Attends Archivist Meetings: 1 to 4 times per year    Marital Status: Living with partner   Family History: Family History  Problem Relation Age of Onset   Diabetes Mother    Allergies: No Known Allergies  Medications Prior to Admission  Medication Sig Dispense Refill Last Dose   albuterol (VENTOLIN HFA) 108 (90 Base) MCG/ACT inhaler Inhale 1-2 puffs into the lungs every 6 (six) hours as needed for wheezing or shortness of breath. 6.7 g 0 Past Month   aspirin EC 81 MG tablet Take 1 tablet (81 mg total) by mouth daily. Swallow whole. 30 tablet 12 Past Week   Prenat w/o A Vit-FeFum-FePo-FA (FOLIVANE-OB) 85-1 MG CAPS Take 1 capsule by mouth daily.   10/12/2022   Accu-Chek Softclix Lancets lancets Use 4 times daily as instructed 100 each 12    acetaminophen (TYLENOL) 325 MG tablet Take 2 tablets (650 mg total) by mouth every 4 (four) hours as needed (for pain scale < 4).   More than a month   Blood Glucose Monitoring Suppl (ACCU-CHEK GUIDE) w/Device KIT Check blood sugar 4 times daily as instructed 1 kit 0    Blood Pressure Monitoring (BLOOD PRESSURE KIT) DEVI 1 Device by Does not apply route as needed. 1 each 0    glucose blood test strip Use 4 times daily as instructed 100 each 12    Misc. Devices (GOJJI WEIGHT SCALE) MISC 1 Device by Does not apply route as needed. (Patient not taking: Reported on 09/23/2022) 1 each 0    Prenat w/o A Vit-FeFum-FePo-FA (CONCEPT OB) 130-92.4-1 MG CAPS Take 1 tablet by mouth daily. (Patient not taking: Reported on 07/05/2022) 30 capsule 12    Prenatal Vit-Fe Fumarate-FA (PRENATAL MULTIVITAMIN) TABS tablet Take 1 tablet by mouth daily at 12 noon. (Patient not taking: Reported on 09/23/2022)      Review of Systems  All systems reviewed and  negative except as stated in HPI  Blood pressure 137/72, pulse 69, temperature 98.3 F (36.8 C), temperature source Oral, resp. rate 17, height 5' 6"$  (1.676 m), weight 100.9 kg, last menstrual period 01/03/2022.  General appearance: alert, cooperative, and  appears stated age Lungs: clear to auscultation bilaterally Heart: regular rate and rhythm Abdomen: soft, non-tender; bowel sounds normal Pelvic: see below Extremities: Homans sign is negative, no sign of DVT  Presentation: cephalic  Fetal monitoring: 135bpm, moderate variability, +accels, no decels Uterine activity: irregular mild contractions every 3-5 mins  Dilation: 1.5 Effacement (%): Thick Station: -3 Exam by:: J.Cox, RN  Prenatal labs: ABO, Rh: --/--/O POS (02/11 OC:[id number]) Antibody: NEG (02/11 0727) Rubella: 2.69 (08/22 1629) RPR: Non Reactive (11/27 0829)  HBsAg: Negative (08/22 1629)  HIV: Non Reactive (11/27 0829)  GBS: Negative/-- (01/22 1200)  2hr GTT: elevated (89/189/108) Genetic screening normal Anatomy US normal  Prenatal Transfer Tool  Maternal Diabetes: Yes:  Diabetes Type:  Diet controlled Genetic Screening: Normal Maternal Ultrasounds/Referrals: Normal Fetal Ultrasounds or other Referrals:  Referred to Materal Fetal Medicine  Maternal Substance Abuse:  No Significant Maternal Medications:  None Significant Maternal Lab Results:  Group B Strep negative Number of Prenatal Visits:greater than 3 verified prenatal visits Other Comments:  None  Results for orders placed or performed during the hospital encounter of 10/13/22 (from the past 24 hour(s))  CBC   Collection Time: 10/13/22  7:08 AM  Result Value Ref Range   WBC 6.2 4.0 - 10.5 K/uL   RBC 4.27 3.87 - 5.11 MIL/uL   Hemoglobin 13.0 12.0 - 15.0 g/dL   HCT 37.4 36.0 - 46.0 %   MCV 87.6 80.0 - 100.0 fL   MCH 30.4 26.0 - 34.0 pg   MCHC 34.8 30.0 - 36.0 g/dL   RDW 14.0 11.5 - 15.5 %   Platelets 236 150 - 400 K/uL   nRBC 0.0 0.0 - 0.2 %  Protein  / creatinine ratio, urine   Collection Time: 10/13/22  7:08 AM  Result Value Ref Range   Creatinine, Urine 132 mg/dL   Total Protein, Urine 11 mg/dL   Protein Creatinine Ratio 0.08 0.00 - 0.15 mg/mg[Cre]  Comprehensive metabolic panel   Collection Time: 10/13/22  7:08 AM  Result Value Ref Range   Sodium 135 135 - 145 mmol/L   Potassium 3.7 3.5 - 5.1 mmol/L   Chloride 103 98 - 111 mmol/L   CO2 23 22 - 32 mmol/L   Glucose, Bld 100 (H) 70 - 99 mg/dL   BUN 6 6 - 20 mg/dL   Creatinine, Ser 0.85 0.44 - 1.00 mg/dL   Calcium 9.1 8.9 - 10.3 mg/dL   Total Protein 7.5 6.5 - 8.1 g/dL   Albumin 3.1 (L) 3.5 - 5.0 g/dL   AST 23 15 - 41 U/L   ALT 17 0 - 44 U/L   Alkaline Phosphatase 195 (H) 38 - 126 U/L   Total Bilirubin 0.8 0.3 - 1.2 mg/dL   GFR, Estimated >60 >60 mL/min   Anion gap 9 5 - 15  Type and screen   Collection Time: 10/13/22  7:27 AM  Result Value Ref Range   ABO/RH(D) O POS    Antibody Screen NEG    Sample Expiration      10/16/2022,2359 Performed at [facility], [address], [place], [place] [address]   Glucose, capillary   Collection Time: 10/13/22  8:57 AM  Result Value Ref Range   Glucose-Capillary 90 70 - 99 mg/dL   Patient Active Problem List   Diagnosis Date Noted   Gestational diabetes mellitus (GDM) 10/13/2022   Gestational diabetes mellitus (GDM) in third trimester 08/20/2022   Hypertension affecting pregnancy, antepartum 03/21/2022   Asthma 03/21/2022  Supervision of high risk pregnancy, antepartum 03/21/2022   Assessment/Plan:  Devany Paulman is a 32 y.o. G2P1001 at 46w0dhere for IOL for A1GDM and cHTN (no meds).  #Labor:Admit to labor and delivery. Start with cytotec 570mPO, 25 mg vaginal. Declines foley balloon for IOL #Pain: Desires epidural when active, IV fentanyl PRN #FWB: Cat 1 #ID:  GBS neg #MOF: Both #MOC: POPs (Slynd) #Circ:  N/A #GDM: q4CBG #cHTN: BP mildly elevated on arrival (145/82), will monitor  ChLiliane ChannelD  MP[facility] OB Fellow, FaS[facility] 10/13/2022

## 2022-10-13 NOTE — Lactation Note (Signed)
This note was copied from a baby's chart. Lactation Consultation Note  Patient Name: Madison Allen Today's Date: 10/13/2022 Reason for consult: Initial assessment;Term;Maternal endocrine disorder Age:32 hours Mom stated the baby last BF around 1400. Attempted at 1700 and wasn't interested. Gave formula after that. Baby has been sleepy. LC had to stimulate the baby a lot. Baby had stool in diaper so LC changed diaper. Room temp 75. Very warm in room. Baby's cheeks flushed. Suggested parents lower temperature. High temp can make the baby very sleepy and with hat, blanket and clothes baby can get to hot. FOB lowered temp. Worked w/mom on positioning, support, and latching. Baby latch and BF well. Mom very pleased. Newborn behavior, feeding habits, STS, I&O reviewed. Mom encouraged to feed baby 8-12 times/24 hours and with feeding cues.  Mom's choice of feeding is breast and formula.  LC suggested that mom supplement After BF w/formula since she was GDM and baby had 2 glucose of 44. That if baby misses feedings her glucose can drop. Supplementing until mom's milk comes in recommended. Mom in agreement. Asked mom if she would like to pump, it would be mainly for stimulation and if she can collect any colostrum give to baby as supplement. Mom agreed to. Mom shown how to use DEBP & how to disassemble, clean, & reassemble parts. Mom was pumping when [name] left room.  Maternal Data Has patient been taught Hand Expression?: Yes Does the patient have breastfeeding experience prior to this delivery?: Yes How long did the patient breastfeed?: 1 yr to her now 32 yr old.  Feeding Nipple Type: Extra Slow Flow  LATCH Score Latch: Grasps breast easily, tongue down, lips flanged, rhythmical sucking.  Audible Swallowing: A few with stimulation  Type of Nipple: Everted at rest and after stimulation  Comfort (Breast/Nipple): Soft / non-tender  Hold (Positioning): Assistance needed to correctly position  infant at breast and maintain latch.  LATCH Score: 8   Lactation Tools Discussed/Used    Interventions Interventions: Breast feeding basics reviewed;Adjust position;Assisted with latch;Support pillows;Skin to skin;Position options;Breast massage;Hand express;LC Services brochure;Breast compression  Discharge    Consult Status Consult Status: Follow-up Date: 10/14/22 Follow-up type: In-patient    Theodoro Kalata 10/13/2022, 9:50 PM

## 2022-10-14 ENCOUNTER — Other Ambulatory Visit (HOSPITAL_COMMUNITY): Payer: Self-pay

## 2022-10-14 LAB — GLUCOSE, CAPILLARY: Glucose-Capillary: 88 mg/dL (ref 70–99)

## 2022-10-14 MED ORDER — IBUPROFEN 600 MG PO TABS
600.0000 mg | ORAL_TABLET | Freq: Four times a day (QID) | ORAL | 0 refills | Status: DC
  Filled 2022-10-14: qty 30, 8d supply, fill #0

## 2022-10-14 MED ORDER — AMLODIPINE BESYLATE 5 MG PO TABS
5.0000 mg | ORAL_TABLET | Freq: Every day | ORAL | 1 refills | Status: DC
  Filled 2022-10-14: qty 30, 30d supply, fill #0

## 2022-10-14 MED ORDER — AMLODIPINE BESYLATE 5 MG PO TABS
5.0000 mg | ORAL_TABLET | Freq: Every day | ORAL | Status: DC
  Administered 2022-10-14: 5 mg via ORAL
  Filled 2022-10-14: qty 1

## 2022-10-14 MED ORDER — SLYND 4 MG PO TABS
1.0000 | ORAL_TABLET | Freq: Every day | ORAL | 3 refills | Status: DC
  Filled 2022-10-14: qty 28, 28d supply, fill #0

## 2022-10-14 MED ORDER — FUROSEMIDE 20 MG PO TABS
20.0000 mg | ORAL_TABLET | Freq: Every day | ORAL | 0 refills | Status: DC
  Filled 2022-10-14: qty 4, 4d supply, fill #0

## 2022-10-14 NOTE — Discharge Instructions (Signed)
-   Continue your prenatal vitamins especially if breastfeeding - Try to eat iron rich food. - Take over the counter tylenol ('500mg'$ ) or ibuprofen ('200mg'$ ) three times a day as needed for cramping/pain. - Take your blood pressure medication daily - Take water pill (lasix) as prescribed for a total of 5 days. - follow up in clinic in 1 week for a blood pressure check and in 4-6 weeks as scheduled for your regular post partum visit. You will get a blood sugar test done at your 6 week post partum visit. - Please come back to [facility] if you notice persistently elevated blood pressures or you start to have a headache, that doesn't get better with medications (tylenol and ibuprofen), rest (4hrs of sleep) and drinking water.

## 2022-10-14 NOTE — Plan of Care (Signed)
adequate

## 2022-10-14 NOTE — Lactation Note (Signed)
This note was copied from a baby's chart. 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  Suggest mother pump at home if continuing to give formula for stimulation. Reviewed engorgement care and monitoring voids/stools.   Maternal Data Does the patient have breastfeeding experience prior to this delivery?: Yes  Feeding Mother's Current Feeding Choice: Breast Milk and Formula  Lactation Tools Discussed/Used  DEBP  Interventions Interventions: Education  Discharge Discharge Education: Engorgement and breast care;Warning signs for feeding baby Pump: Personal;DEBP (Motif)  Consult Status Consult Status: Complete Date: 10/14/22    Vivianne Master [facility] 10/14/2022, 11:03 AM

## 2022-10-14 NOTE — Social Work (Signed)
CSW received consult for RN observing a beer bottle in the room on the floor. CSW met with MOB to assess for safety. And offer support. CSW entered the room observed MOB in bed holding the infant and FOB asleep on the couch ay bedside. CSW introduced self, and CSW role. CSW inquired about how MOB was feeling, MOB reported good. CSW provided education regarding the baby blues period vs. perinatal mood disorders, discussed treatment and gave resources for mental health follow up if concerns arise.  CSW recommends self-evaluation during the postpartum time period using the New Mom Checklist from Postpartum Progress and encouraged MOB to contact a medical professional if symptoms are noted at any time.   CSW provided review of Sudden Infant Death Syndrome (SIDS) precautions.  MOB reported she has all necessary items for the infant including a bassinet for the infant to sleep  CSW observed open half full corona bottle on the floor, CSW inquired about the bottle. MOB pointed to FOB, FOB then turned around and asked why CSW was asking, CSW explained to FOB alcohol was not permitted on campus. CSW inquired about who the glass belonged too, FOB reported me, FOB proceeded to get up pour the coron\a out and dispose of the bottle.  CSW inquired if there were any other beverage in the room, FOB  reported no. CSW inquired if FOB could drive hom, FOB reported yes ad MOB reported he is fine.   CSW reassessed MOB when FOB went to pull the car around. CSW inquired if MOB  feels safe  in the home  MOB reported yes it was just one drink this doesn't happen often. MOB reported no concerns.  CSW identifies no further need for intervention and no barriers to discharge at this time.  Madison Allen, [place] Social Worker [phone]

## 2022-10-21 ENCOUNTER — Other Ambulatory Visit: Payer: Self-pay

## 2022-10-21 ENCOUNTER — Ambulatory Visit (INDEPENDENT_AMBULATORY_CARE_PROVIDER_SITE_OTHER): Payer: Medicaid Other

## 2022-10-21 VITALS — BP 134/83 | HR 65 | Ht 66.0 in | Wt 207.1 lb

## 2022-10-21 DIAGNOSIS — O165 Unspecified maternal hypertension, complicating the puerperium: Secondary | ICD-10-CM

## 2022-10-21 DIAGNOSIS — Z0130 Encounter for examination of blood pressure without abnormal findings: Secondary | ICD-10-CM

## 2022-10-21 MED ORDER — AMLODIPINE BESYLATE 10 MG PO TABS: 10.0000 mg | ORAL_TABLET | Freq: Every day | ORAL | 0 refills | Status: AC

## 2022-10-21 NOTE — Progress Notes (Signed)
Blood Pressure Check Visit  Madison Allen is here for blood pressure check following spontaneous vaginal on 10/13/22. BP today is 148/90 and 134/83. Patient endorses intermittent HA that doesn't go away with medication but denies any dizzness blurred vision shortness of breath peripheral edema. Patient currently taking amlodipine 63m daily. Reviewed with Dr. NErnestina Patches Per provider, increased amlodipine to 146mdaily PO and patient to follow-up for another BP check in 1 week. Explained plan of care to patient; patient verbalized understanding and had no questions or concerns.  New Rx sent to pharmacy on file; verified pharmacy with patient.   Madison MoneyRN 10/21/2022  8:22 AM

## 2022-10-28 ENCOUNTER — Ambulatory Visit (INDEPENDENT_AMBULATORY_CARE_PROVIDER_SITE_OTHER): Payer: Medicaid Other | Admitting: *Deleted

## 2022-10-28 ENCOUNTER — Other Ambulatory Visit: Payer: Self-pay

## 2022-10-28 VITALS — BP 132/86 | HR 71 | Ht 66.0 in | Wt 203.3 lb

## 2022-10-28 DIAGNOSIS — Z0130 Encounter for examination of blood pressure without abnormal findings: Secondary | ICD-10-CM

## 2022-10-28 NOTE — Progress Notes (Signed)
Here for bp check s/p vaginal delivery 10/13/22 after IOL for GDM A1, and CHTN. D/c on Norvasc. She states she has been taken everyday except not yet today. C/o occasional mild headaches relieved with ibuprofen. Denies edema. BP wnl. Advised to keep pp visit and reminded will be doing 2 hr gtt . Pre-eclampsia precautions given.  She voices understanding.  Staci Acosta

## 2022-11-16 ENCOUNTER — Other Ambulatory Visit: Payer: Self-pay | Admitting: Family Medicine

## 2022-11-16 DIAGNOSIS — O165 Unspecified maternal hypertension, complicating the puerperium: Secondary | ICD-10-CM

## 2022-11-18 ENCOUNTER — Other Ambulatory Visit: Payer: Self-pay

## 2022-11-18 DIAGNOSIS — O24439 Gestational diabetes mellitus in the puerperium, unspecified control: Secondary | ICD-10-CM

## 2022-11-18 NOTE — Progress Notes (Unsigned)
[place] Partum Visit Note  Madison Allen is a 32 y.o. G6P2002 female who presents for a postpartum visit. She is 5 weeks postpartum following a normal spontaneous vaginal delivery.  I have fully reviewed the prenatal and intrapartum course. The delivery was at 39.0 gestational weeks.  Anesthesia: none. Postpartum course has been uneventful. Baby is doing well. Baby is feeding by bottle - Similac Advance. Bleeding no bleeding. Bowel function is normal. Bladder function is normal. Patient is not sexually active. Contraception method is oral progesterone-only contraceptive. Postpartum depression screening: negative.   Upstream - 11/19/22 1115       Pregnancy Intention Screening   Does the patient want to become pregnant in the next year? No    Does the patient's partner want to become pregnant in the next year? No    Would the patient like to discuss contraceptive options today? Yes      Contraception Wrap Up   Current Method Oral Contraceptive    End Method IUD or IUS    Contraception Counseling Provided Yes    How was the end contraceptive method provided? Provided on site            The pregnancy intention screening data noted above was reviewed. Potential methods of contraception were discussed. The patient elected to proceed with IUD or IUS.   Edinburgh Postnatal Depression Scale - 11/19/22 1113       Edinburgh Postnatal Depression Scale:  In the Past 7 Days   I have been able to laugh and see the funny side of things. 0    I have looked forward with enjoyment to things. 0    I have blamed myself unnecessarily when things went wrong. 0    I have been anxious or worried for no good reason. 0    I have felt scared or panicky for no good reason. 0    Things have been getting on top of me. 0    I have been so unhappy that I have had difficulty sleeping. 0    I have felt sad or miserable. 0    I have been so unhappy that I have been crying. 0    The thought of harming myself  has occurred to me. 0    Edinburgh Postnatal Depression Scale Total 0             Health Maintenance Due  Topic Date Due   COVID-19 Vaccine (1) Never done   HPV VACCINES (3 - 3-dose series) 09/08/2014    The following portions of the patient's history were reviewed and updated as appropriate: allergies, current medications, past family history, past medical history, past social history, past surgical history, and problem list.  Review of Systems Pertinent items noted in HPI and remainder of comprehensive ROS otherwise negative.  Objective:  BP (!) 140/96   Pulse 65   Ht 5\' 6"  (1.676 m)   Wt 203 lb (92.1 kg)   LMP  (LMP Unknown)   Breastfeeding Yes   BMI 32.77 kg/m    General:  alert, cooperative, and appears stated age   Breasts:  not indicated  Lungs: Comfortalbe on room air  Wound N/a  GU exam:  not indicated         Assessment:    There are no diagnoses linked to this encounter.  Normal postpartum exam.   Plan:   Essential components of care per ACOG recommendations:  1.  Mood and well being: Patient with negative  depression screening today. Reviewed local resources for support.  - Patient tobacco use? No.   - hx of drug use? No.    2. Infant care and feeding:  -Patient currently breastmilk feeding? No.  -Social determinants of health (SDOH) reviewed in EPIC. No concerns  3. Sexuality, contraception and birth spacing - Patient does not want a pregnancy in the next year.  Desired family size is 3 children.  - Reviewed reproductive life planning. Reviewed contraceptive methods based on pt preferences and effectiveness.  Patient desired IUD or IUS today.  She will return in 1 month for placement. - Discussed birth spacing of 18 months  4. Sleep and fatigue -Encouraged family/partner/community support of 4 hrs of uninterrupted sleep to help with mood and fatigue  5. Physical Recovery  - Discussed patients delivery and complications. She describes her  labor as good. - Patient had a Vaginal, no problems at delivery. Patient had a 2nd degree laceration. Perineal healing reviewed. Patient expressed understanding - Patient has urinary incontinence? No. - Patient is safe to resume physical and sexual activity  6.  Health Maintenance - HM due items addressed Yes - Last pap smear  Diagnosis  Date Value Ref Range Status  04/23/2022   Final   - Negative for intraepithelial lesion or malignancy (NILM)   Pap smear not done at today's visit.  -Breast Cancer screening indicated? No.   7. Chronic Disease/Pregnancy Condition follow up: Hypertension and Gestational Diabetes - Essential hypertension: mildly hypertensive, did not take meds today because of GTT, coming back in 1 month for IUD insertion, instructed to stop taking meds 1 week before and we will see what her BP is that day - GDM: 2hr GTT today. Discussed that even if normal she is still at increased lifetime risk of DM, needs regular screening - PCP follow up  [facility] [place]

## 2022-11-19 ENCOUNTER — Ambulatory Visit: Payer: Medicaid Other | Admitting: Family Medicine

## 2022-11-19 ENCOUNTER — Other Ambulatory Visit: Payer: Self-pay

## 2022-11-19 ENCOUNTER — Other Ambulatory Visit: Payer: Medicaid Other

## 2022-11-19 ENCOUNTER — Encounter: Payer: Self-pay | Admitting: Family Medicine

## 2022-11-19 DIAGNOSIS — O24439 Gestational diabetes mellitus in the puerperium, unspecified control: Secondary | ICD-10-CM

## 2022-11-19 DIAGNOSIS — O169 Unspecified maternal hypertension, unspecified trimester: Secondary | ICD-10-CM

## 2022-11-19 DIAGNOSIS — Z8632 Personal history of gestational diabetes: Secondary | ICD-10-CM

## 2022-11-20 LAB — GLUCOSE TOLERANCE, 2 HOURS
Glucose, 2 hour: 85 mg/dL (ref 70–139)
Glucose, GTT - Fasting: 84 mg/dL (ref 70–99)

## 2022-11-25 ENCOUNTER — Encounter: Payer: Self-pay | Admitting: Family Medicine

## 2022-12-17 ENCOUNTER — Ambulatory Visit: Payer: Medicaid Other | Admitting: Obstetrics and Gynecology

## 2022-12-17 ENCOUNTER — Ambulatory Visit: Payer: Medicaid Other

## 2022-12-19 ENCOUNTER — Ambulatory Visit: Payer: Medicaid Other

## 2023-08-14 ENCOUNTER — Ambulatory Visit (HOSPITAL_COMMUNITY)
Admission: EM | Admit: 2023-08-14 | Discharge: 2023-08-14 | Disposition: A | Discharge status: Home / Self Care | Payer: Medicaid Other | Attending: Internal Medicine | Admitting: Internal Medicine

## 2023-08-14 ENCOUNTER — Other Ambulatory Visit: Payer: Self-pay | Admitting: Family Medicine

## 2023-08-14 ENCOUNTER — Telehealth: Payer: Self-pay | Admitting: Family Medicine

## 2023-08-14 ENCOUNTER — Encounter (HOSPITAL_COMMUNITY): Payer: Self-pay | Admitting: *Deleted

## 2023-08-14 DIAGNOSIS — K529 Noninfective gastroenteritis and colitis, unspecified: Secondary | ICD-10-CM

## 2023-08-14 DIAGNOSIS — R0789 Other chest pain: Secondary | ICD-10-CM

## 2023-08-14 DIAGNOSIS — O165 Unspecified maternal hypertension, complicating the puerperium: Secondary | ICD-10-CM

## 2023-08-14 LAB — POCT URINE PREGNANCY: Preg Test, Ur: NEGATIVE

## 2023-08-14 MED ORDER — ONDANSETRON 4 MG PO TBDP
ORAL_TABLET | ORAL | Status: AC
  Filled 2023-08-14: qty 1

## 2023-08-14 MED ORDER — ONDANSETRON 4 MG PO TBDP
4.0000 mg | ORAL_TABLET | Freq: Once | ORAL | Status: AC
  Administered 2023-08-14: 4 mg via ORAL

## 2023-08-14 MED ORDER — IBUPROFEN 600 MG PO TABS: 600.0000 mg | ORAL_TABLET | Freq: Four times a day (QID) | ORAL | 0 refills | Status: DC | PRN

## 2023-08-14 MED ORDER — ONDANSETRON 4 MG PO TBDP: 4.0000 mg | ORAL_TABLET | Freq: Three times a day (TID) | ORAL | 0 refills | Status: DC | PRN

## 2023-08-14 NOTE — Telephone Encounter (Signed)
Patient called in requesting a refill for amLODipine (NORVASC) 10 MG tablet [[id number]] .

## 2023-08-14 NOTE — ED Provider Notes (Signed)
[facility]    CSN: [id number] Arrival date & time: 08/14/23  2130      History   Chief Complaint Chief Complaint  Patient presents with   Emesis   Chest Pain    HPI Madison Allen is a 32 y.o. female comes to the urgent care with recurrent vomiting, diarrhea and sharp chest pain which started last night.  Patient says the onset was abrupt and has been persistent throughout the night.  She has had 2 episodes of diarrhea.  Diarrhea is not bloody or mucoid.  Patient has multiple episodes of nonbloody nonbilious vomiting.  No sick contacts.  No change in dietary habits.  No upper respiratory infection symptoms.  Patient's chest pain started after the vomiting started.  Pain is precordial, sharp and aggravated by taking a deep breath.  No known relieving factors.  Patient denies any fever or chills.  No shortness of breath or wheezing.  No calf pain.  No recent long distance travel.  HPI  Past Medical History:  Diagnosis Date   Asthma    last used inhaler in January   Gestational diabetes    Hypertension     Patient Active Problem List   Diagnosis Date Noted   Gestational diabetes mellitus (GDM) 10/13/2022   Gestational diabetes mellitus (GDM) in third trimester 08/20/2022   Hypertension affecting pregnancy, antepartum 03/21/2022   Asthma 03/21/2022    Past Surgical History:  Procedure Laterality Date   NO PAST SURGERIES      OB History     Gravida  2   Para  2   Term  2   Preterm      AB      Living  2      SAB      IAB      Ectopic      Multiple  0   Live Births  2            Home Medications    Prior to Admission medications   Medication Sig Start Date End Date Taking? Authorizing Provider  ibuprofen (ADVIL) 600 MG tablet Take 1 tablet (600 mg total) by mouth every 6 (six) hours as needed. 08/14/23  Yes Louellen Haldeman, Britta Mccreedy, MD  ondansetron (ZOFRAN-ODT) 4 MG disintegrating tablet Take 1 tablet (4 mg total) by mouth every 8  (eight) hours as needed for nausea or vomiting. 08/14/23  Yes Taimane Stimmel, Britta Mccreedy, MD  amLODipine (NORVASC) 10 MG tablet Take 1 tablet (10 mg total) by mouth daily. 10/21/22   Federico Flake, MD    Family History Family History  Problem Relation Age of Onset   Diabetes Mother     Social History Social History   Tobacco Use   Smoking status: Never   Smokeless tobacco: Never  Vaping Use   Vaping status: Never Used  Substance Use Topics   Alcohol use: Not Currently   Drug use: Never     Allergies   Patient has no known allergies.   Review of Systems Review of Systems As per HPI  Physical Exam Triage Vital Signs ED Triage Vitals  Encounter Vitals Group     BP 08/14/23 0836 (!) 178/132     Systolic BP Percentile --      Diastolic BP Percentile --      Pulse Rate 08/14/23 0836 72     Resp 08/14/23 0836 18     Temp 08/14/23 0836 98.3 F (36.8 C)     Temp Source  08/14/23 0836 Oral     SpO2 08/14/23 0836 (!) 72 %     Weight --      Height --      Head Circumference --      Peak Flow --      Pain Score 08/14/23 0835 10     Pain Loc --      Pain Education --      Exclude from Growth Chart --    No data found.  Updated Vital Signs BP (!) 144/94 (BP Location: Left Arm)   Pulse 80   Temp 98.3 F (36.8 C) (Oral)   Resp 18   LMP 07/30/2023 (Exact Date)   SpO2 97%   Visual Acuity Right Eye Distance:   Left Eye Distance:   Bilateral Distance:    Right Eye Near:   Left Eye Near:    Bilateral Near:     Physical Exam Vitals and nursing note reviewed.  Constitutional:      General: She is not in acute distress.    Appearance: She is not ill-appearing.  Cardiovascular:     Rate and Rhythm: Normal rate and regular rhythm.     Heart sounds: Normal heart sounds.  Pulmonary:     Effort: Pulmonary effort is normal.     Breath sounds: No decreased breath sounds, wheezing, rhonchi or rales.  Abdominal:     Palpations: Abdomen is soft. There is no  hepatomegaly, splenomegaly or mass.  Musculoskeletal:     Cervical back: Normal range of motion.  Neurological:     Mental Status: She is alert.      UC Treatments / Results  Labs (all labs ordered are listed, but only abnormal results are displayed) Labs Reviewed  POCT URINE PREGNANCY - Normal    EKG   Radiology No results found.  Procedures Procedures (including critical care time)  Medications Ordered in UC Medications  ondansetron (ZOFRAN-ODT) disintegrating tablet 4 mg (4 mg Oral Given 08/14/23 0903)    Initial Impression / Assessment and Plan / UC Course  I have reviewed the triage vital signs and the nursing notes.  Pertinent labs & imaging results that were available during my care of the patient were reviewed by me and considered in my medical decision making (see chart for details).     1.  Acute gastroenteritis: Zofran and ODT x 1 dose Zofran ODT 4 mg orally 3 times daily as needed for nausea/vomiting Patient is advised to increase fluid intake Ibuprofen as needed for pain for chest wall pain Gentle stretching exercises Return precautions given  Final Clinical Impressions(s) / UC Diagnoses   Final diagnoses:  Acute gastroenteritis  Chest wall pain     Discharge Instructions      Please increase oral fluid intake Take medications as prescribed You may take ibuprofen as needed for chest pain If you have worsening symptoms please feel free to return to urgent care to be reevaluated.   ED Prescriptions     Medication Sig Dispense Auth. Provider   ondansetron (ZOFRAN-ODT) 4 MG disintegrating tablet Take 1 tablet (4 mg total) by mouth every 8 (eight) hours as needed for nausea or vomiting. 15 tablet Tennyson Wacha, Britta Mccreedy, MD   ibuprofen (ADVIL) 600 MG tablet Take 1 tablet (600 mg total) by mouth every 6 (six) hours as needed. 20 tablet Luvia Orzechowski, Britta Mccreedy, MD      PDMP not reviewed this encounter.   Merrilee Jansky, MD 08/14/23 [id number]

## 2023-08-14 NOTE — Telephone Encounter (Signed)
Per chart review, Madison Allen was prescribed Amlodipine (30 [address] supply, no refill) on 10/21/22 which was 8 days after baby's birth. She did not keep appt on 12/19/22 for BP check. I called Madison Allen and discussed her request for refill of Amlodipine. I advised that our providers will not be able to refill her prescription and that she needs evaluation from her PCP to insure she is prescribed the proper medication, if needed. Madison Allen voiced understanding.

## 2023-08-14 NOTE — ED Triage Notes (Addendum)
Pt states that she has been vomiting since last night into this morning. She states her chest hurts only when she takes a deep breath but denies any other respiratory sx. She did use her albuterol MDI this morning.   Pt requesting a pregnancy test as well.

## 2023-08-14 NOTE — Discharge Instructions (Addendum)
Please increase oral fluid intake Take medications as prescribed You may take ibuprofen as needed for chest pain If you have worsening symptoms please feel free to return to urgent care to be reevaluated.

## 2023-12-04 ENCOUNTER — Encounter (HOSPITAL_COMMUNITY): Payer: Self-pay

## 2023-12-04 ENCOUNTER — Ambulatory Visit (HOSPITAL_COMMUNITY): Admission: EM | Admit: 2023-12-04 | Discharge: 2023-12-04 | Disposition: A | Discharge status: Home / Self Care

## 2023-12-04 DIAGNOSIS — Z3202 Encounter for pregnancy test, result negative: Secondary | ICD-10-CM

## 2023-12-04 DIAGNOSIS — R112 Nausea with vomiting, unspecified: Secondary | ICD-10-CM | POA: Diagnosis not present

## 2023-12-04 DIAGNOSIS — A084 Viral intestinal infection, unspecified: Secondary | ICD-10-CM

## 2023-12-04 LAB — POCT URINALYSIS DIP (MANUAL ENTRY)
Bilirubin, UA: NEGATIVE
Blood, UA: NEGATIVE
Glucose, UA: NEGATIVE mg/dL
Ketones, POC UA: NEGATIVE mg/dL
Leukocytes, UA: NEGATIVE
Nitrite, UA: NEGATIVE
Protein Ur, POC: NEGATIVE mg/dL
Spec Grav, UA: 1.02
Urobilinogen, UA: 2 U/dL — AB
pH, UA: 5.5

## 2023-12-04 LAB — POCT URINE PREGNANCY: Preg Test, Ur: NEGATIVE

## 2023-12-04 MED ORDER — IBUPROFEN 800 MG PO TABS: 800.0000 mg | ORAL_TABLET | Freq: Once | ORAL | Status: DC

## 2023-12-04 MED ORDER — ONDANSETRON 4 MG PO TBDP
ORAL_TABLET | ORAL | Status: AC
  Filled 2023-12-04: qty 1

## 2023-12-04 MED ORDER — ONDANSETRON 4 MG PO TBDP
4.0000 mg | ORAL_TABLET | Freq: Once | ORAL | Status: AC
  Administered 2023-12-04: 4 mg via ORAL

## 2023-12-04 MED ORDER — ONDANSETRON 4 MG PO TBDP: 4.0000 mg | ORAL_TABLET | Freq: Three times a day (TID) | ORAL | 0 refills | Status: AC | PRN

## 2023-12-04 NOTE — ED Provider Notes (Addendum)
 [facility]    CSN: [id number] Arrival date & time: 12/04/23  0806      History   Chief Complaint Chief Complaint  Patient presents with   Emesis    HPI Madison Allen is a 33 y.o. female.   Patient presents with diarrhea, vomiting, and abdominal cramping that began last night.  Patient states that she now has a headache at this morning.  Patient reports 5 episodes of diarrhea and 3 episodes of vomiting in the last 12 hours.  Patient denies eating anything new or eating out at a restaurant last night.  Denies blood in stool or vomit, severe abdominal pain, fever, dizziness, passing out, and urinary symptoms.  Patient denies taking any medications for symptoms.  LMP 11/07/2023.  Patient states that she is not currently on any birth control and has had recent unprotected sexual intercourse.   Emesis   Past Medical History:  Diagnosis Date   Asthma    last used inhaler in January   Gestational diabetes    Hypertension     Patient Active Problem List   Diagnosis Date Noted   Gestational diabetes mellitus (GDM) 10/13/2022   Gestational diabetes mellitus (GDM) in third trimester 08/20/2022   Hypertension affecting pregnancy, antepartum 03/21/2022   Asthma 03/21/2022    Past Surgical History:  Procedure Laterality Date   NO PAST SURGERIES      OB History     Gravida  2   Para  2   Term  2   Preterm      AB      Living  2      SAB      IAB      Ectopic      Multiple  0   Live Births  2            Home Medications    Prior to Admission medications   Medication Sig Start Date End Date Taking? Authorizing Provider  ALBUTEROL IN Inhale into the lungs.   Yes [provider]  ondansetron (ZOFRAN-ODT) 4 MG disintegrating tablet Take 1 tablet (4 mg total) by mouth every 8 (eight) hours as needed for nausea or vomiting. 12/04/23  Yes Susann Givens, [place] Rico A, NP  amLODipine (NORVASC) 10 MG tablet Take 1 tablet (10 mg total) by mouth  daily. 10/21/22   Federico Flake, MD    Family History Family History  Problem Relation Age of Onset   Diabetes Mother     Social History Social History   Tobacco Use   Smoking status: Never   Smokeless tobacco: Never  Vaping Use   Vaping status: Never Used  Substance Use Topics   Alcohol use: Not Currently   Drug use: Never     Allergies   Patient has no known allergies.   Review of Systems Review of Systems  Gastrointestinal:  Positive for vomiting.   Per HPI  Physical Exam Triage Vital Signs ED Triage Vitals  Encounter Vitals Group     BP 12/04/23 0832 130/84     Systolic BP Percentile --      Diastolic BP Percentile --      Pulse Rate 12/04/23 0832 90     Resp 12/04/23 0832 18     Temp 12/04/23 0832 98.4 F (36.9 C)     Temp Source 12/04/23 0832 Oral     SpO2 12/04/23 0832 96 %     Weight 12/04/23 0831 220 lb (99.8 kg)  Height 12/04/23 0831 5\' 6"  (1.676 m)     Head Circumference --      Peak Flow --      Pain Score 12/04/23 0830 10     Pain Loc --      Pain Education --      Exclude from Growth Chart --    No data found.  Updated Vital Signs BP 130/84 (BP Location: Left Arm)   Pulse 90   Temp 98.4 F (36.9 C) (Oral)   Resp 18   Ht 5\' 6"  (1.676 m)   Wt 220 lb (99.8 kg)   LMP 11/07/2023 (Approximate)   SpO2 96%   Breastfeeding No   BMI 35.51 kg/m   Visual Acuity Right Eye Distance:   Left Eye Distance:   Bilateral Distance:    Right Eye Near:   Left Eye Near:    Bilateral Near:     Physical Exam Vitals and nursing note reviewed.  Constitutional:      General: She is awake. She is not in acute distress.    Appearance: Normal appearance. She is well-developed and well-groomed. She is ill-appearing. She is not toxic-appearing or diaphoretic.  Cardiovascular:     Rate and Rhythm: Normal rate and regular rhythm.  Pulmonary:     Effort: Pulmonary effort is normal.     Breath sounds: Normal breath sounds.  Abdominal:      General: Abdomen is protuberant. Bowel sounds are normal. There is no distension.     Palpations: Abdomen is soft.     Tenderness: There is no abdominal tenderness. There is no guarding or rebound.  Skin:    General: Skin is warm and dry.  Neurological:     Mental Status: She is alert.  Psychiatric:        Behavior: Behavior is cooperative.      UC Treatments / Results  Labs (all labs ordered are listed, but only abnormal results are displayed) Labs Reviewed  POCT URINALYSIS DIP (MANUAL ENTRY) - Abnormal; Notable for the following components:      Result Value   Color, UA straw (*)    Urobilinogen, UA 2.0 (*)    All other components within normal limits  POCT URINE PREGNANCY    EKG   Radiology No results found.  Procedures Procedures (including critical care time)  Medications Ordered in UC Medications  ondansetron (ZOFRAN-ODT) disintegrating tablet 4 mg (4 mg Oral Given 12/04/23 0858)    Initial Impression / Assessment and Plan / UC Course  I have reviewed the triage vital signs and the nursing notes.  Pertinent labs & imaging results that were available during my care of the patient were reviewed by me and considered in my medical decision making (see chart for details).     Upon assessment nontender to abdomen upon palpation.  Abdomen is soft and nondistended.  Patient became nauseous upon palpating abdomen and began to dry heave.  Urine pregnancy negative and urinalysis insignificant.  Given Zofran in clinic with relief of nausea and vomiting.  Patient was able to tolerate drinking water in clinic.  Ordered ibuprofen for headache in clinic, but patient left prior to receiving this.  Prescribe Zofran as needed for nausea vomiting.  Discussed over-the-counter Imodium if needed for diarrhea.  Discussed importance of hydration.  Discussed return precautions Final Clinical Impressions(s) / UC Diagnoses   Final diagnoses:  Viral gastroenteritis  Nausea vomiting  and diarrhea     Discharge Instructions      As  discussed I believe your symptoms are likely related to a viral gastroenteritis, or stomach virus. I have prescribed Zofran that you can take every 8 hours as needed for nausea and vomiting.  You received a dose here today around 9 AM so you can take the next dose around 5 PM this evening. If you continue to have diarrhea I recommend taking Imodium which you can purchase over-the-counter. Otherwise make sure you are staying hydrated with water, Pedialyte, or Gatorade.  Make sure you are getting plenty of rest. Work your way back up to your regular diet as tolerated.  Recommend starting on a bland foods like bread, toast, crackers, and soups. Return here if symptoms persist or worsen.     ED Prescriptions     Medication Sig Dispense Auth. Provider   ondansetron (ZOFRAN-ODT) 4 MG disintegrating tablet Take 1 tablet (4 mg total) by mouth every 8 (eight) hours as needed for nausea or vomiting. 10 tablet Wynonia Lawman A, NP      PDMP not reviewed this encounter.   Letta Kocher, NP 12/04/23 3086    Letta Kocher, NP 12/04/23 0955    Letta Kocher, NP 12/04/23 1001

## 2023-12-04 NOTE — Discharge Instructions (Signed)
 As discussed I believe your symptoms are likely related to a viral gastroenteritis, or stomach virus. I have prescribed Zofran that you can take every 8 hours as needed for nausea and vomiting.  You received a dose here today around 9 AM so you can take the next dose around 5 PM this evening. If you continue to have diarrhea I recommend taking Imodium which you can purchase over-the-counter. Otherwise make sure you are staying hydrated with water, Pedialyte, or Gatorade.  Make sure you are getting plenty of rest. Work your way back up to your regular diet as tolerated.  Recommend starting on a bland foods like bread, toast, crackers, and soups. Return here if symptoms persist or worsen.

## 2023-12-04 NOTE — ED Triage Notes (Signed)
 Chief Complaint: diarrhea, emesis, and headache.   Sick exposure: No  Onset: Last night  Prescriptions or OTC medications tried: Yes- tylenol, Albuterol Inhaler    with little relief  New foods, medications, or products: No  Recent Travel: No

## 2024-01-29 ENCOUNTER — Encounter (HOSPITAL_COMMUNITY): Payer: Self-pay | Admitting: *Deleted

## 2024-01-29 ENCOUNTER — Emergency Department (HOSPITAL_COMMUNITY)
Admission: EM | Admit: 2024-01-29 | Discharge: 2024-01-29 | Disposition: A | Discharge status: Home / Self Care | Attending: Emergency Medicine | Admitting: Emergency Medicine

## 2024-01-29 ENCOUNTER — Other Ambulatory Visit: Payer: Self-pay

## 2024-01-29 ENCOUNTER — Emergency Department (HOSPITAL_COMMUNITY)

## 2024-01-29 DIAGNOSIS — J45909 Unspecified asthma, uncomplicated: Secondary | ICD-10-CM | POA: Insufficient documentation

## 2024-01-29 DIAGNOSIS — I10 Essential (primary) hypertension: Secondary | ICD-10-CM | POA: Insufficient documentation

## 2024-01-29 DIAGNOSIS — Z79899 Other long term (current) drug therapy: Secondary | ICD-10-CM | POA: Diagnosis not present

## 2024-01-29 DIAGNOSIS — K219 Gastro-esophageal reflux disease without esophagitis: Secondary | ICD-10-CM | POA: Insufficient documentation

## 2024-01-29 DIAGNOSIS — R072 Precordial pain: Secondary | ICD-10-CM | POA: Diagnosis present

## 2024-01-29 DIAGNOSIS — R079 Chest pain, unspecified: Secondary | ICD-10-CM

## 2024-01-29 DIAGNOSIS — R197 Diarrhea, unspecified: Secondary | ICD-10-CM | POA: Diagnosis not present

## 2024-01-29 LAB — TROPONIN I (HIGH SENSITIVITY): Troponin I (High Sensitivity): 7 ng/L (ref <18)

## 2024-01-29 LAB — CBC
HCT: 35.2 % — ABNORMAL LOW (ref 36.0–46.0)
Hemoglobin: 11.9 g/dL — ABNORMAL LOW (ref 12.0–15.0)
MCH: 29.9 pg (ref 26.0–34.0)
MCHC: 33.8 g/dL (ref 30.0–36.0)
MCV: 88.4 fL (ref 80.0–100.0)
Platelets: 286 10*3/uL (ref 150–400)
RBC: 3.98 MIL/uL (ref 3.87–5.11)
RDW: 12.6 % (ref 11.5–15.5)
WBC: 5.9 10*3/uL (ref 4.0–10.5)
nRBC: 0 % (ref 0.0–0.2)

## 2024-01-29 LAB — COMPREHENSIVE METABOLIC PANEL WITH GFR
ALT: 19 U/L (ref 0–44)
AST: 21 U/L (ref 15–41)
Albumin: 4 g/dL (ref 3.5–5.0)
Alkaline Phosphatase: 70 U/L (ref 38–126)
Anion gap: 10 (ref 5–15)
BUN: 12 mg/dL (ref 6–20)
CO2: 23 mmol/L (ref 22–32)
Calcium: 9.1 mg/dL (ref 8.9–10.3)
Chloride: 104 mmol/L (ref 98–111)
Creatinine, Ser: 0.9 mg/dL (ref 0.44–1.00)
GFR, Estimated: >60 mL/min (ref >60)
Glucose, Bld: 139 mg/dL — ABNORMAL HIGH (ref 70–99)
Potassium: 3.7 mmol/L (ref 3.5–5.1)
Sodium: 137 mmol/L (ref 135–145)
Total Bilirubin: 0.6 mg/dL (ref 0.0–1.2)
Total Protein: 8.2 g/dL — ABNORMAL HIGH (ref 6.5–8.1)

## 2024-01-29 LAB — URINALYSIS, ROUTINE W REFLEX MICROSCOPIC
Bilirubin Urine: NEGATIVE
Glucose, UA: NEGATIVE mg/dL
Hgb urine dipstick: NEGATIVE
Ketones, ur: NEGATIVE mg/dL
Leukocytes,Ua: NEGATIVE
Nitrite: NEGATIVE
Protein, ur: NEGATIVE mg/dL
Specific Gravity, Urine: 1.017 (ref 1.005–1.030)
pH: 6 (ref 5.0–8.0)

## 2024-01-29 LAB — HCG, SERUM, QUALITATIVE: Preg, Serum: NEGATIVE

## 2024-01-29 MED ORDER — FAMOTIDINE 20 MG PO TABS
20.0000 mg | ORAL_TABLET | Freq: Once | ORAL | Status: AC
  Administered 2024-01-29: 20 mg via ORAL
  Filled 2024-01-29: qty 1

## 2024-01-29 NOTE — ED Notes (Signed)
 Pt provided urine specimen cup in lobby.

## 2024-01-29 NOTE — ED Provider Notes (Signed)
 [place] EMERGENCY DEPARTMENT AT [facility] Provider Note   CSN: [id number] Arrival date & time: 01/29/24  0032     History  Chief Complaint  Patient presents with   Diarrhea   Chest Pain    Madison Allen is a 33 y.o. female.  Patient with past medical history significant for asthma and hypertension presents to the emergency room complaining of substernal chest pain which began this evening.  She states initially she thought she may be having an asthma exacerbation and tried her inhaler and then tried her child's nebulized albuterol  without relief.  She denies feeling short of breath.  She states that the pain that she was experiencing initially felt like a burning sensation.  Patient also endorses intermittent diarrhea for the past week.  She denies abdominal pain, nausea, vomiting.   Diarrhea Chest Pain      Home Medications Prior to Admission medications   Medication Sig Start Date End Date Taking? Authorizing Provider  ALBUTEROL  IN Inhale into the lungs.    [provider]  amLODipine  (NORVASC ) 10 MG tablet Take 1 tablet (10 mg total) by mouth daily. 10/21/22   Abner Ables, MD  ondansetron  (ZOFRAN -ODT) 4 MG disintegrating tablet Take 1 tablet (4 mg total) by mouth every 8 (eight) hours as needed for nausea or vomiting. 12/04/23   Karon Packer, NP      Allergies    Patient has no known allergies.    Review of Systems   Review of Systems  Cardiovascular:  Positive for chest pain.  Gastrointestinal:  Positive for diarrhea.    Physical Exam Updated Vital Signs BP (!) 142/94 (BP Location: Left Arm)   Pulse 70   Temp 98.2 F (36.8 C)   Resp 20   Ht 5\' 6"  (1.676 m)   Wt 99.8 kg   LMP 01/29/2024   SpO2 97%   BMI 35.51 kg/m  Physical Exam Vitals and nursing note reviewed.  Constitutional:      General: She is not in acute distress.    Appearance: She is well-developed.  HENT:     Head: Normocephalic and atraumatic.  Eyes:      Conjunctiva/sclera: Conjunctivae normal.  Cardiovascular:     Rate and Rhythm: Normal rate and regular rhythm.  Pulmonary:     Effort: Pulmonary effort is normal. No respiratory distress.     Breath sounds: Normal breath sounds.  Chest:     Chest wall: Tenderness present.     Comments: Mild tenderness to palpation over the sternal region Abdominal:     Palpations: Abdomen is soft.     Tenderness: There is no abdominal tenderness.  Musculoskeletal:        General: No swelling.     Cervical back: Neck supple.  Skin:    General: Skin is warm and dry.     Capillary Refill: Capillary refill takes less than 2 seconds.  Neurological:     Mental Status: She is alert.  Psychiatric:        Mood and Affect: Mood normal.     ED Results / Procedures / Treatments   Labs (all labs ordered are listed, but only abnormal results are displayed) Labs Reviewed  COMPREHENSIVE METABOLIC PANEL WITH GFR - Abnormal; Notable for the following components:      Result Value   Glucose, Bld 139 (*)    Total Protein 8.2 (*)    All other components within normal limits  CBC - Abnormal; Notable for the  following components:   Hemoglobin 11.9 (*)    HCT 35.2 (*)    All other components within normal limits  URINALYSIS, ROUTINE W REFLEX MICROSCOPIC  HCG, SERUM, QUALITATIVE  TROPONIN I (HIGH SENSITIVITY)    EKG None  Radiology DG Chest Portable 1 View Result Date: 01/29/2024 CLINICAL DATA:  Chest pain EXAM: PORTABLE CHEST 1 VIEW COMPARISON:  11/01/2021 FINDINGS: The lungs are clear without focal pneumonia, edema, pneumothorax or pleural effusion. Cardiopericardial silhouette is at upper limits of normal for size. No acute bony abnormality. Telemetry leads overlie the chest. IMPRESSION: No active disease. Electronically Signed   By: Donnal Fusi M.D.   On: 01/29/2024 05:44    Procedures Procedures    Medications Ordered in ED Medications  famotidine  (PEPCID ) tablet 20 mg (20 mg Oral Given  01/29/24 1884)    ED Course/ Medical Decision Making/ A&P                                 Medical Decision Making Amount and/or Complexity of Data Reviewed Radiology: ordered.   This patient presents to the ED for concern of chest pain, this involves an extensive number of treatment options, and is a complaint that carries with it a high risk of complications and morbidity.  The differential diagnosis includes GERD, ACS, musculoskeletal pain, pneumonia, PE, others   Co morbidities / Chronic conditions that complicate the patient evaluation  Asthma   Additional history obtained:  Additional history obtained from EMR   Lab Tests:  I Ordered, and personally interpreted labs.  The pertinent results include: Troponin 7, grossly unremarkable CMP, CBC   Imaging Studies ordered:  I ordered imaging studies including chest x-ray I independently visualized and interpreted imaging which showed no active disease I agree with the radiologist interpretation   Cardiac Monitoring: / EKG:  The patient was maintained on a cardiac monitor.  I personally viewed and interpreted the cardiac monitored which showed an underlying rhythm of: Sinus rhythm   Problem List / ED Course / Critical interventions / Medication management   I ordered medication including Pepcid  Reevaluation of the patient after these medicines showed that the patient improved I have reviewed the patients home medicines and have made adjustments as needed   Social Determinants of Health:  Patient has Medicaid for her primary health insurance type   Test / Admission - Considered:  Negative troponin with a nonischemic EKG.  No sign of ACS.  No pneumonia on chest x-ray.  Patient is not short of breath, not tachycardic.  Presentation not consistent with a pulmonary embolism.  Patient did describe a burning sensation.  Question if this may be reflux.  Patient treated with Pepcid  with moderate relief.  Plan to discharge  home at this time with recommendation for trial of Pepcid  at home.  No indication for admission.  Return precautions provided.         Final Clinical Impression(s) / ED Diagnoses Final diagnoses:  Diarrhea, unspecified type  Chest pain, unspecified type  Gastroesophageal reflux disease, unspecified whether esophagitis present    Rx / DC Orders ED Discharge Orders     None         Delories Fetter 01/29/24 1660    Lindle Rhea, MD 01/30/24 [phone]

## 2024-01-29 NOTE — Discharge Instructions (Addendum)
 Your workup this morning was reassuring.  Your symptoms seem consistent with acid reflux.  I recommend trying medication over-the-counter such as Pepcid which should help with reflux-like symptoms.  If you develop any life-threatening symptoms please return to the emergency department.

## 2024-01-29 NOTE — ED Triage Notes (Signed)
 The pt is c/o diarrhea since Monday chest pain today  lmp monday
